# Patient Record
Sex: Female | Born: 1980 | Race: White | Hispanic: No | Smoking: Never smoker
Health system: Southern US, Community
[De-identification: ages and names within clinical notes are randomized; demographics above are authoritative.]

## PROBLEM LIST (undated history)

## (undated) DIAGNOSIS — C539 Malignant neoplasm of cervix uteri, unspecified: Secondary | ICD-10-CM

## (undated) DIAGNOSIS — N200 Calculus of kidney: Secondary | ICD-10-CM

## (undated) DIAGNOSIS — E66813 Obesity, class 3: Secondary | ICD-10-CM

## (undated) DIAGNOSIS — D279 Benign neoplasm of unspecified ovary: Secondary | ICD-10-CM

## (undated) DIAGNOSIS — N390 Urinary tract infection, site not specified: Secondary | ICD-10-CM

## (undated) DIAGNOSIS — N159 Renal tubulo-interstitial disease, unspecified: Secondary | ICD-10-CM

## (undated) DIAGNOSIS — Z923 Personal history of irradiation: Secondary | ICD-10-CM

## (undated) HISTORY — DX: Obesity, class 3: E66.813

## (undated) HISTORY — DX: Personal history of irradiation: Z92.3

## (undated) HISTORY — PX: OTHER SURGICAL HISTORY: SHX169

## (undated) HISTORY — PX: LAPAROSCOPIC UNILATERAL SALPINGO OOPHERECTOMY: SHX5935

## (undated) HISTORY — DX: Morbid (severe) obesity due to excess calories: E66.01

---
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
## 1998-01-22 ENCOUNTER — Inpatient Hospital Stay (HOSPITAL_COMMUNITY): Admission: AD | Admit: 1998-01-22 | Discharge: 1998-01-22 | Payer: Self-pay | Admitting: Obstetrics and Gynecology

## 1998-02-23 ENCOUNTER — Inpatient Hospital Stay (HOSPITAL_COMMUNITY): Admission: AD | Admit: 1998-02-23 | Discharge: 1998-02-23 | Payer: Self-pay | Admitting: Obstetrics and Gynecology

## 1998-04-01 ENCOUNTER — Other Ambulatory Visit: Admission: RE | Admit: 1998-04-01 | Discharge: 1998-04-01 | Payer: Self-pay | Admitting: *Deleted

## 1998-04-29 ENCOUNTER — Inpatient Hospital Stay (HOSPITAL_COMMUNITY): Admission: AD | Admit: 1998-04-29 | Discharge: 1998-05-01 | Payer: Self-pay | Admitting: Obstetrics and Gynecology

## 1999-02-17 ENCOUNTER — Other Ambulatory Visit: Admission: RE | Admit: 1999-02-17 | Discharge: 1999-02-17 | Payer: Self-pay | Admitting: *Deleted

## 1999-04-11 ENCOUNTER — Inpatient Hospital Stay (HOSPITAL_COMMUNITY): Admission: AD | Admit: 1999-04-11 | Discharge: 1999-04-11 | Payer: Self-pay | Admitting: *Deleted

## 1999-07-24 ENCOUNTER — Inpatient Hospital Stay (HOSPITAL_COMMUNITY): Admission: AD | Admit: 1999-07-24 | Discharge: 1999-07-24 | Payer: Self-pay | Admitting: *Deleted

## 1999-07-25 ENCOUNTER — Inpatient Hospital Stay (HOSPITAL_COMMUNITY): Admission: AD | Admit: 1999-07-25 | Discharge: 1999-07-25 | Payer: Self-pay | Admitting: *Deleted

## 1999-07-26 ENCOUNTER — Inpatient Hospital Stay (HOSPITAL_COMMUNITY): Admission: AD | Admit: 1999-07-26 | Discharge: 1999-07-26 | Payer: Self-pay | Admitting: Obstetrics & Gynecology

## 1999-08-04 ENCOUNTER — Inpatient Hospital Stay (HOSPITAL_COMMUNITY): Admission: AD | Admit: 1999-08-04 | Discharge: 1999-08-07 | Payer: Self-pay | Admitting: Obstetrics & Gynecology

## 1999-08-04 ENCOUNTER — Inpatient Hospital Stay (HOSPITAL_COMMUNITY): Admission: AD | Admit: 1999-08-04 | Discharge: 1999-08-04 | Payer: Self-pay | Admitting: Obstetrics and Gynecology

## 1999-11-13 ENCOUNTER — Encounter: Payer: Self-pay | Admitting: Emergency Medicine

## 1999-11-13 ENCOUNTER — Emergency Department (HOSPITAL_COMMUNITY): Admission: EM | Admit: 1999-11-13 | Discharge: 1999-11-14 | Payer: Self-pay | Admitting: Emergency Medicine

## 1999-11-30 ENCOUNTER — Inpatient Hospital Stay (HOSPITAL_COMMUNITY): Admission: RE | Admit: 1999-11-30 | Discharge: 1999-12-03 | Payer: Self-pay | Admitting: Obstetrics & Gynecology

## 1999-11-30 ENCOUNTER — Encounter (INDEPENDENT_AMBULATORY_CARE_PROVIDER_SITE_OTHER): Payer: Self-pay | Admitting: Specialist

## 1999-12-04 ENCOUNTER — Encounter: Payer: Self-pay | Admitting: Emergency Medicine

## 1999-12-04 ENCOUNTER — Emergency Department (HOSPITAL_COMMUNITY): Admission: EM | Admit: 1999-12-04 | Discharge: 1999-12-04 | Payer: Self-pay | Admitting: Emergency Medicine

## 1999-12-06 DIAGNOSIS — D279 Benign neoplasm of unspecified ovary: Secondary | ICD-10-CM

## 1999-12-06 HISTORY — DX: Benign neoplasm of unspecified ovary: D27.9

## 2000-03-24 ENCOUNTER — Emergency Department (HOSPITAL_COMMUNITY): Admission: EM | Admit: 2000-03-24 | Discharge: 2000-03-24 | Payer: Self-pay | Admitting: Emergency Medicine

## 2000-07-29 ENCOUNTER — Emergency Department (HOSPITAL_COMMUNITY): Admission: EM | Admit: 2000-07-29 | Discharge: 2000-07-29 | Payer: Self-pay | Admitting: Emergency Medicine

## 2001-02-22 ENCOUNTER — Emergency Department (HOSPITAL_COMMUNITY): Admission: EM | Admit: 2001-02-22 | Discharge: 2001-02-22 | Payer: Self-pay | Admitting: Specialist

## 2001-04-28 ENCOUNTER — Inpatient Hospital Stay (HOSPITAL_COMMUNITY): Admission: AD | Admit: 2001-04-28 | Discharge: 2001-04-28 | Payer: Self-pay | Admitting: Obstetrics and Gynecology

## 2004-03-17 ENCOUNTER — Emergency Department (HOSPITAL_COMMUNITY): Admission: EM | Admit: 2004-03-17 | Discharge: 2004-03-17 | Payer: Self-pay | Admitting: Emergency Medicine

## 2004-03-17 ENCOUNTER — Emergency Department (HOSPITAL_COMMUNITY): Admission: EM | Admit: 2004-03-17 | Discharge: 2004-03-17 | Payer: Self-pay | Admitting: Family Medicine

## 2004-06-01 ENCOUNTER — Inpatient Hospital Stay (HOSPITAL_COMMUNITY): Admission: AD | Admit: 2004-06-01 | Discharge: 2004-06-01 | Payer: Self-pay | Admitting: Obstetrics and Gynecology

## 2004-06-02 ENCOUNTER — Emergency Department (HOSPITAL_COMMUNITY): Admission: EM | Admit: 2004-06-02 | Discharge: 2004-06-02 | Payer: Self-pay | Admitting: Emergency Medicine

## 2004-09-22 ENCOUNTER — Inpatient Hospital Stay (HOSPITAL_COMMUNITY): Admission: AD | Admit: 2004-09-22 | Discharge: 2004-09-22 | Payer: Self-pay | Admitting: *Deleted

## 2007-03-10 ENCOUNTER — Emergency Department (HOSPITAL_COMMUNITY): Admission: EM | Admit: 2007-03-10 | Discharge: 2007-03-11 | Payer: Self-pay | Admitting: *Deleted

## 2008-06-21 ENCOUNTER — Emergency Department (HOSPITAL_COMMUNITY): Admission: EM | Admit: 2008-06-21 | Discharge: 2008-06-22 | Payer: Self-pay | Admitting: Emergency Medicine

## 2008-06-29 ENCOUNTER — Emergency Department (HOSPITAL_COMMUNITY): Admission: EM | Admit: 2008-06-29 | Discharge: 2008-06-29 | Payer: Self-pay | Admitting: Emergency Medicine

## 2009-07-26 ENCOUNTER — Emergency Department (HOSPITAL_COMMUNITY): Admission: EM | Admit: 2009-07-26 | Discharge: 2009-07-26 | Payer: Self-pay | Admitting: Family Medicine

## 2009-07-26 ENCOUNTER — Emergency Department (HOSPITAL_COMMUNITY): Admission: EM | Admit: 2009-07-26 | Discharge: 2009-07-27 | Payer: Self-pay | Admitting: Emergency Medicine

## 2010-11-07 LAB — URINALYSIS, ROUTINE W REFLEX MICROSCOPIC
Glucose, UA: NEGATIVE mg/dL
Nitrite: NEGATIVE
Protein, ur: NEGATIVE mg/dL
Urobilinogen, UA: 1 mg/dL (ref 0.0–1.0)

## 2010-11-07 LAB — POCT I-STAT, CHEM 8
BUN: 7 mg/dL (ref 6–23)
HCT: 39 % (ref 36.0–46.0)
Hemoglobin: 13.3 g/dL (ref 12.0–15.0)
Sodium: 141 mEq/L (ref 135–145)
TCO2: 24 mmol/L (ref 0–100)

## 2010-11-07 LAB — POCT PREGNANCY, URINE: Preg Test, Ur: NEGATIVE

## 2010-12-23 NOTE — Discharge Summary (Signed)
[facility]  Patient:    Maria Davidson, Maria Davidson [facility]                    MRN: [id number] Adm. Date:  [id number] Disc. Date: [id number] Attending:  Donnetta Hutching                           Discharge Summary  NOTE:  The patient was originally discharged from [facility].  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  OPERATIONS AND PROCEDURES:  Exploratory laparotomy, right salpingo-oophorectomy, right ovarian cystectomy.  BRIEF HISTORY:  The patient is a 30 year old gravida 2, para 2, who presented with increasing abdominal girth and pelvic pain.  She was noted by ultrasound to have an approximately 30 cm pelvic mass.  This was removed without difficulty on November 10, 1999.  HOSPITAL COURSE:  Essentially unremarkable.  She was voiding well, ambulating well, tolerated p.o. well, at time of her discharge.  On postoperative day #3 her incision was clean and dry and intact.  She was afebrile and at this point there are no contraindications against her being discharged.  PHYSICAL EXAMINATION:  VITAL SIGNS:  Stable.  Patient is afebrile.  LUNGS:  Clear to auscultation bilaterally.  HEART:  Regular rate and rhythm.  ABDOMEN:  Soft, nontender, nondistended.  The incision was clean, dry and intact.  EXTREMITIES:  Revealed no clubbing, cyanosis, or edema.  DISCHARGE LABORATORY DATA:  White count 5, hemoglobin 10.1, hematocrit 29.8, platelets 187, pathology thus far shows no malignant cells on peritoneal washings.  DISCHARGE MEDICATION INSTRUCTIONS:  DIET:  As tolerated.  ACTIVITY:  Per [facility] OB/GYN instruction sheet.  MEDICATIONS:  Roxicet liquid for pain control.  FOLLOWUP:  The patient will return to the office on May, 2, 2001, for staple removal and then again in 6 weeks. DD:  12/03/99 TD:  12/07/99 Job: [id number]

## 2010-12-23 NOTE — Discharge Summary (Signed)
[facility]  Patient:    Maria Davidson, CRUTCHER [facility]                    MRN: [id number] Adm. Date:  [id number] Disc. Date: [id number] Attending:  Donnetta Hutching                           Discharge Summary  DISCHARGE DIAGNOSES: 1. Pelvic pain. 2. 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  OPERATIONS AND PROCEDURES: 1. Exploratory laparotomy. 2. Right salpingo-oophorectomy. 3. Right ovarian cystectomy.  HISTORY OF PRESENT ILLNESS:  The patient is a 30 year old who was found to have an approximately 30 cm pelvic mass.  The patient was also complaining of pelvic pain and increasing abdominal girth.  HOSPITAL COURSE:  She underwent an uneventful procedure on November 30, 1999. The hospital course was essentially unremarkable.  She was voiding well, ambulating well, and tolerating p.o. well at the time of her discharge on postoperative day #3.  The incision was clean, dry, and intact.  At this point there were no contraindication to her being discharged.  DISCHARGE PHYSICAL EXAMINATION:  Vital signs stable.  The patient was afebrile.  LUNGS:  Clear to auscultation bilaterally.  HEART:  Regular rate and rhythm.  ABDOMEN:  Soft and nondistended.  The incision was clean, dry, and intact.  EXTREMITIES:  No clubbing, cyanosis, or edema.  DISCHARGE LABORATORY DATA:  The white count was 5.0, hemoglobin 10.1, hematocrit 29.8, and platelets 187.  Pathology showed benign mucinous cystadenoma, unremarkable fallopian tube, and no evidence of borderline change or malignancy identified.  Peritoneal washings were negative.  DIET:  As tolerated.  ACTIVITY:  As per the [facility] OB/GYN instruction sheet.  DISCHARGE MEDICATIONS:  Liquid Roxicet as needed for pain.  FOLLOW-UP:  Will be in one week for staple removal and then in six weeks for postoperative check. DD:  12/19/99 TD:  12/20/99 Job: [id number]

## 2010-12-23 NOTE — H&P (Signed)
[facility]  Patient:    Maria Davidson, Maria Davidson [facility]                    MRN: [id number] Adm. Date:  [id number] Disc. Date: [id number] Attending:  Donnetta Hutching                         History and Physical  DATE OF BIRTH:  [birth year].  HISTORY OF PRESENT ILLNESS:  The patient is a 30 year old gravida 2, para 2, who is status post vaginal delivery in December.  The patient presents complaining of increasing abdominal girth and sudden onset of pelvic pain. The patient had previously been complaining of vaginal bleeding since the time of her delivery.  She was treated for this bleeding with Depo-Provera and subsequent to that was given estrogen tablets.  The patient states her lower abdominal pain began approximately the beginning of April and began worsening approximately April 6.  She has tried Motrin for the pain, that has worked with good success.  She denies nausea, vomiting, or diarrhea, but has had some dizziness over the initial weeks and has also had no strength.  Her increasing abdominal girth has been occurring for the past month and a half.  Her abdomen appears to be tender and swollen, "I still look pregnant."  The patient was evaluated at [facility] ER and found to have a large right adnexal mass.  The patient is therefore being consented for exploratory laparotomy and removal of this pelvic mass.  ALLERGIES:  The patient has no known drug allergies.  MEDICATIONS:  She is taking no medications on a regular basis.  She is status post injection of Depo-Provera on March 8.  PAST MEDICAL HISTORY:  She denies medical illnesses.  PAST SURGICAL HISTORY:  Noncontributory.  FAMILY HISTORY:  Uncle with diabetes.  Both grandfathers had lung cancer. Maternal grandmother also had lung cancer.  Sister has had cervical cancer.  SOCIAL HISTORY:  The patient is engaged.  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  She denies alcohol, drug, or  tobacco use.  REVIEW OF SYSTEMS:  Noncontributory.  PHYSICAL EXAMINATION:  VITAL SIGNS:  Stable, the patient is afebrile.  LUNGS:  Clear to auscultation bilaterally.  HEART:  Regular rate and rhythm.  ABDOMEN:  Soft, diffusely tender.  The margins of the mass appear to go from the suprapubic area to well above the umbilicus.  It is mobile.  There are no fluid waves.  EXTREMITIES:  No clubbing, cyanosis, or edema.  PELVIC:  External genitalia is within normal limits.  Vagina is without discharge.  Cervix is without lesion.  The uterus is difficult to palpate secondary to the mass.  There appears to be fullness in the right adnexa that goes up to the umbilicus, and rectovaginal confirms.  LABORATORY DATA:  GYN ultrasound shows a complex cystic mass within the pelvis that extends into the abdomen, appearing to arise from the right adnexa, measures 26.9 cm superiorly to inferiorly, 22.5 cm right to left, with scattered septations.  GC and Chlamydia cultures from March 2000 are negative. Tumor markers (LDH, CA-125, CEA, AFP tumor marker, and beta hCG) are all negative.  Hemoglobin 12.7, hematocrit 37.6.  ASSESSMENT:  Large pelvic mass arising into the abdomen, pelvic pain, history of metrorrhagia.  PLAN:  Patient has been consented for exploratory laparotomy, right ovarian cystectomy, and right salpingo-oophorectomy.  Risks, benefits, and alternatives to the procedure were  reviewed with the patient and her family. Risks included a risk of bleeding, risk of infection, risk of damage to internal organs such as bowel, bladder, or ureter.  It appears that this cyst is of a benign course but reviewed with the patient that should malignant cells be noted, then the GYN oncologist will be asked to perform lymph node dissection and omentectomy.  The patient and her family are willing to accept these risks and will return to the office for her postoperative visit. DD:  12/19/99 TD:   12/19/99 Job: [id number]

## 2010-12-23 NOTE — H&P (Signed)
[facility]  Patient:    LILOU, KNEIP [facility]                    MRN: [id number] Adm. Date:  [id number] Disc. Date: [id number] Attending:  Donnetta Hutching                         History and Physical  HISTORY OF PRESENT ILLNESS:  The patient is a 30 year old gravida 2, para 2, who presented with increasing abdominal girth and pelvic pain.  The patient was noted to have an approximately 30 cm pelvic mass.  Her tumor markers are normal.  She is consented for exploratory laparotomy and removal of said pelvic mass.  PAST MEDICAL HISTORY: DD:  03/12/00 TD:  03/13/00 Job: [id number]

## 2011-05-09 LAB — DIFFERENTIAL
Basophils Absolute: 0
Eosinophils Absolute: 0
Eosinophils Relative: 0
Lymphs Abs: 2.1
Neutrophils Relative %: 72

## 2011-05-09 LAB — CBC
Hemoglobin: 12
MCHC: 33.3
MCV: 87.3
RBC: 4.12
WBC: 8.8

## 2011-05-09 LAB — URINE CULTURE

## 2011-05-09 LAB — D-DIMER, QUANTITATIVE: D-Dimer, Quant: 0.22

## 2011-05-09 LAB — COMPREHENSIVE METABOLIC PANEL
ALT: 12
AST: 14
CO2: 22
Calcium: 8.9
Chloride: 107
Creatinine, Ser: 0.62
GFR calc non Af Amer: >60
Glucose, Bld: 102 — ABNORMAL HIGH
Total Bilirubin: 0.8

## 2011-05-09 LAB — POCT PREGNANCY, URINE: Preg Test, Ur: NEGATIVE

## 2011-05-09 LAB — URINALYSIS, ROUTINE W REFLEX MICROSCOPIC
Bilirubin Urine: NEGATIVE
Ketones, ur: 15 — AB
Nitrite: POSITIVE — AB
Protein, ur: NEGATIVE
Urobilinogen, UA: 0.2

## 2011-05-22 LAB — URINE CULTURE

## 2011-05-22 LAB — URINALYSIS, ROUTINE W REFLEX MICROSCOPIC
Glucose, UA: NEGATIVE
Nitrite: NEGATIVE
Specific Gravity, Urine: 1.028
pH: 6

## 2011-05-22 LAB — DIFFERENTIAL
Eosinophils Absolute: 0
Lymphs Abs: 2.3
Monocytes Absolute: 0.5
Monocytes Relative: 9
Neutrophils Relative %: 55

## 2011-05-22 LAB — BASIC METABOLIC PANEL
CO2: 27
Chloride: 101
Creatinine, Ser: 0.66
GFR calc Af Amer: >60
Potassium: 3.3 — ABNORMAL LOW
Sodium: 138

## 2011-05-22 LAB — URINE MICROSCOPIC-ADD ON

## 2011-05-22 LAB — CBC
Hemoglobin: 13.3
MCHC: 34.6
MCV: 83.2
RBC: 4.62
WBC: 6.2

## 2011-05-22 LAB — PREGNANCY, URINE: Preg Test, Ur: NEGATIVE

## 2016-01-23 ENCOUNTER — Encounter (HOSPITAL_COMMUNITY): Payer: Self-pay

## 2016-01-23 ENCOUNTER — Inpatient Hospital Stay (HOSPITAL_COMMUNITY)
Admission: AD | Admit: 2016-01-23 | Discharge: 2016-01-23 | Disposition: A | Discharge status: Home / Self Care | Payer: Self-pay | Source: Ambulatory Visit | Attending: Obstetrics and Gynecology | Admitting: Obstetrics and Gynecology

## 2016-01-23 DIAGNOSIS — M549 Dorsalgia, unspecified: Secondary | ICD-10-CM

## 2016-01-23 DIAGNOSIS — M545 Low back pain: Secondary | ICD-10-CM | POA: Insufficient documentation

## 2016-01-23 DIAGNOSIS — R1900 Intra-abdominal and pelvic swelling, mass and lump, unspecified site: Secondary | ICD-10-CM | POA: Insufficient documentation

## 2016-01-23 DIAGNOSIS — Z87891 Personal history of nicotine dependence: Secondary | ICD-10-CM | POA: Insufficient documentation

## 2016-01-23 HISTORY — DX: Benign neoplasm of unspecified ovary: D27.9

## 2016-01-23 HISTORY — DX: Renal tubulo-interstitial disease, unspecified: N15.9

## 2016-01-23 HISTORY — DX: Urinary tract infection, site not specified: N39.0

## 2016-01-23 HISTORY — DX: Calculus of kidney: N20.0

## 2016-01-23 LAB — POCT PREGNANCY, URINE: PREG TEST UR: NEGATIVE

## 2016-01-23 LAB — URINALYSIS, ROUTINE W REFLEX MICROSCOPIC
Bilirubin Urine: NEGATIVE
Glucose, UA: NEGATIVE mg/dL
HGB URINE DIPSTICK: NEGATIVE
KETONES UR: NEGATIVE mg/dL
LEUKOCYTES UA: NEGATIVE
Nitrite: NEGATIVE
PROTEIN: NEGATIVE mg/dL
Specific Gravity, Urine: 1.025 (ref 1.005–1.030)
pH: 5.5 (ref 5.0–8.0)

## 2016-01-23 MED ORDER — CYCLOBENZAPRINE HCL 10 MG PO TABS: 10.0000 mg | ORAL_TABLET | Freq: Two times a day (BID) | ORAL | Status: DC | PRN

## 2016-01-23 NOTE — Discharge Instructions (Signed)

## 2016-01-23 NOTE — MAU Provider Note (Signed)
History   [id number]   Chief Complaint  Patient presents with  . Back Pain    HPI Maria Davidson is a 35 y.o. female  G2P2002 here with report of lower back pain that has become progressively worse in past 72 hours.  Pain shoots on lower back when rising from a bend.  Denies radiation down either leg.  Pain also increases after intercourse.  No report of UTI symptoms.  No dysfunctional uterine bleeding.  No change in bowel or bladder.   Patient's last menstrual period was 01/04/2016 (approximate).  OB History  Gravida Para Term Preterm AB SAB TAB Ectopic Multiple Living  2 2 2       2     # Outcome Date GA Lbr Len/2nd Weight Sex Delivery Anes PTL Lv  2 Term      Vag-Spont   Y  1 Term      Vag-Spont   Y      Past Medical History  Diagnosis Date  . Kidney stones   . Kidney infection   . 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 UTI (urinary tract infection)     History reviewed. No pertinent family history.  Social History   Social History  . Marital Status: Divorced    Spouse Name: N/A  . Number of Children: N/A  . Years of Education: N/A   Social History Main Topics  . Smoking status: Former Research scientist (life sciences)  . Smokeless tobacco: None  . Alcohol Use: Yes     Comment: occ  . Drug Use: No  . Sexual Activity: Yes    Birth Control/ Protection: None   Other Topics Concern  . None   Social History Narrative  . None    Allergies  Allergen Reactions  . Sulfa Antibiotics Swelling and Other (See Comments)    Reaction:  Tongue swelling     No current facility-administered medications on file prior to encounter.   No current outpatient prescriptions on file prior to encounter.     Review of Systems  Constitutional: Negative for fever, chills and appetite change.  Gastrointestinal: Negative for nausea, vomiting, abdominal pain, diarrhea, constipation and abdominal distention.  Genitourinary: Negative for dysuria, frequency, vaginal bleeding, vaginal discharge, vaginal pain and  pelvic pain.  Musculoskeletal: Positive for back pain.  Neurological: Negative for numbness and headaches.  All other systems reviewed and are negative.    Physical Exam   Filed Vitals:   01/23/16 1704 01/23/16 1709  BP: 132/45   Pulse: 97   Temp: 98.2 F (36.8 C)   TempSrc: Oral   Resp: 18   Height: 5\' 7"  (1.702 m)   Weight:  136.079 kg (300 lb)  SpO2: 99%     Physical Exam  Constitutional: She is oriented to person, place, and time. She appears well-developed and well-nourished. No distress.  HENT:  Head: Normocephalic.  Neck: Normal range of motion. Neck supple.  Cardiovascular: Normal rate, regular rhythm and normal heart sounds.   Respiratory: Effort normal and breath sounds normal. No respiratory distress.  GI: Soft. There is tenderness (with palpation of mid left quadrant).  Genitourinary: Left adnexum displays mass, tenderness and fullness.  Musculoskeletal: Normal range of motion. She exhibits no edema.       Lumbar back: She exhibits tenderness and pain. She exhibits normal range of motion and no spasm.  Pain with right lateral flexion of hip  Neurological: She is alert and oriented to person, place, and time. She has normal reflexes.  Skin: Skin  is warm and dry.    MAU Course  Procedures  MDM Results for orders placed or performed during the hospital encounter of 01/23/16 (from the past 24 hour(s))  Urinalysis, Routine w reflex microscopic (not at [facility])     Status: None   Collection Time: 01/23/16  5:12 PM  Result Value Ref Range   Color, Urine YELLOW YELLOW   APPearance CLEAR CLEAR   Specific Gravity, Urine 1.025 1.005 - 1.030   pH 5.5 5.0 - 8.0   Glucose, UA NEGATIVE NEGATIVE mg/dL   Hgb urine dipstick NEGATIVE NEGATIVE   Bilirubin Urine NEGATIVE NEGATIVE   Ketones, ur NEGATIVE NEGATIVE mg/dL   Protein, ur NEGATIVE NEGATIVE mg/dL   Nitrite NEGATIVE NEGATIVE   Leukocytes, UA NEGATIVE NEGATIVE  Pregnancy, urine POC     Status: None   Collection  Time: 01/23/16  5:23 PM  Result Value Ref Range   Preg Test, Ur NEGATIVE NEGATIVE     Assessment and Plan  Back Pain Pelvic Mass  Plan: Pelvic Ultrasound > outpatient RX Flexeril Reviewed warning signs May refer to ortho, pending pelvic ultrasound results  Gwen Pounds, CNM 01/23/2016 8:25 PM

## 2016-01-23 NOTE — MAU Note (Signed)
Pt C/O lower back pain "for a long time", became worse in last 72 hours.  Has occasional SOB @ times, especially when bending over.  Hx of UTI in past.  Denies fever or dysuria.

## 2016-01-26 ENCOUNTER — Ambulatory Visit (HOSPITAL_COMMUNITY)
Admission: RE | Admit: 2016-01-26 | Discharge: 2016-01-26 | Disposition: A | Discharge status: Home / Self Care | Payer: Self-pay | Source: Ambulatory Visit | Attending: Family | Admitting: Family

## 2016-01-26 DIAGNOSIS — M549 Dorsalgia, unspecified: Secondary | ICD-10-CM | POA: Insufficient documentation

## 2016-01-27 ENCOUNTER — Telehealth: Payer: Self-pay | Admitting: General Practice

## 2016-01-27 NOTE — Telephone Encounter (Signed)
Called patient regarding limited but normal ultrasound results. Patient verbalized understanding & asked about next steps for her back pain. Asked patient if she had insurance and she stated no. Told patient to contact [employer] to receive orange card application as well as starting primary care. Told patient they may can assist in helping with her back pain or once approved for the orange card they may be able to refer her to an orthopedist. Also recommended chiropractor. Patient verbalized understanding to all & had no questions

## 2016-01-31 ENCOUNTER — Ambulatory Visit (HOSPITAL_COMMUNITY): Payer: Self-pay

## 2021-01-15 ENCOUNTER — Inpatient Hospital Stay (HOSPITAL_COMMUNITY)
Admission: AD | Admit: 2021-01-15 | Discharge: 2021-01-15 | Disposition: A | Discharge status: Home / Self Care | Payer: Commercial Managed Care - PPO | Attending: Obstetrics and Gynecology | Admitting: Obstetrics and Gynecology

## 2021-01-15 ENCOUNTER — Other Ambulatory Visit: Payer: Self-pay

## 2021-01-15 DIAGNOSIS — N939 Abnormal uterine and vaginal bleeding, unspecified: Secondary | ICD-10-CM

## 2021-01-15 DIAGNOSIS — Z3202 Encounter for pregnancy test, result negative: Secondary | ICD-10-CM | POA: Diagnosis not present

## 2021-01-15 NOTE — MAU Provider Note (Signed)
Event Date/Time   First Provider Initiated Contact with Patient 01/15/21 0855      S Maria Davidson is a 40 y.o. [id number] patient who presents to MAU today with complaint of vaginal bleeding. Reports irregular bleeding for the last 6 months. Has an appointment with [facility] later this month to address this issue. Reports bleeding increased over the last few days. Takes 3 hours to saturate a pad. Denies abdominal pain. Increase in clear mucoid discharge as well. Bleeding decreased this morning. Has felt more tired recently. No dizziness or syncope.    O BP 124/77 (BP Location: Right Arm)   Pulse 95   Temp 97.6 F (36.4 C) (Oral)   Resp 16   SpO2 98% Comment: room air Physical Exam Vitals and nursing note reviewed.  Constitutional:      General: She is not in acute distress.    Appearance: Normal appearance. She is not ill-appearing.  HENT:     Head: Normocephalic and atraumatic.  Eyes:     General: No scleral icterus. Pulmonary:     Effort: Pulmonary effort is normal. No respiratory distress.  Neurological:     Mental Status: She is alert.  Psychiatric:        Mood and Affect: Mood normal.        Behavior: Behavior normal.    A Medical screening exam complete   P Discharge from MAU in stable condition Patient given the option of transfer to [facility] for further evaluation or seek care in outpatient facility of choice - patient prefers to wait for her [facility] appointment Warning signs for worsening condition that would warrant emergency follow-up discussed   Jorje Guild, NP 01/15/2021 8:55 AM

## 2021-01-15 NOTE — MAU Note (Signed)
Maria Davidson is a 40 y.o. here in MAU reporting: for the past 3-4 months has been having irregular periods that are sometimes heavier and is having a lot of cramping. Also having a vaginal discharge that has an odor for the past 3 weeks. 2 days ago she started having heavy bleeding but states no pain. Pt has an appointment at the end of the month at [facility].   LMP: 11/19/20  Onset of complaint: ongoing  Pain score: 0/10  Vitals:   01/15/21 0846  BP: 124/77  Pulse: 95  Resp: 16  Temp: 97.6 F (36.4 C)  SpO2: 98%     Lab orders placed from triage: UPT

## 2021-01-17 LAB — POCT PREGNANCY, URINE: Preg Test, Ur: NEGATIVE

## 2021-06-13 ENCOUNTER — Telehealth: Payer: Self-pay | Admitting: *Deleted

## 2021-06-13 ENCOUNTER — Encounter: Payer: Self-pay | Admitting: Gynecologic Oncology

## 2021-06-13 NOTE — Telephone Encounter (Signed)
Spoke with the patient and scheduled a new patient appt on 11/9 at 10:30 am; arrival time given of 10 am. Patient given the address and phone number of the clinic; along with the policy for mask and visitors.

## 2021-06-14 ENCOUNTER — Telehealth: Payer: Self-pay

## 2021-06-14 ENCOUNTER — Ambulatory Visit
Admission: RE | Admit: 2021-06-14 | Discharge: 2021-06-14 | Disposition: A | Discharge status: Home / Self Care | Payer: Self-pay | Source: Ambulatory Visit | Attending: Gynecologic Oncology | Admitting: Gynecologic Oncology

## 2021-06-14 ENCOUNTER — Other Ambulatory Visit: Payer: Self-pay

## 2021-06-14 DIAGNOSIS — C539 Malignant neoplasm of cervix uteri, unspecified: Secondary | ICD-10-CM

## 2021-06-14 NOTE — Telephone Encounter (Signed)
Received call from Maria Davidson. Patient states she called her OB office to follow up with them after her colposcopy last week. She is having vaginal bleeding, no pain and some cramping. Patient due to start her menstrual cycle. Per patient OB triage instructed her to notify them if she is changing her pad hourly. Patient reports changing pad every 2-3 hours. Patient wondering if she needs to do anything differently prior to her appointment tomorrow. Instructed patient that we will still be able to see her tomorrow with the bleeding. If she has questions or concerns related to her bleeding to contact her OB office. Patient verbalized understanding.

## 2021-06-15 ENCOUNTER — Telehealth: Payer: Self-pay | Admitting: Oncology

## 2021-06-15 ENCOUNTER — Inpatient Hospital Stay: Payer: Commercial Managed Care - PPO | Attending: Gynecologic Oncology | Admitting: Gynecologic Oncology

## 2021-06-15 ENCOUNTER — Encounter: Payer: Self-pay | Admitting: Hematology and Oncology

## 2021-06-15 ENCOUNTER — Encounter: Payer: Self-pay | Admitting: Gynecologic Oncology

## 2021-06-15 ENCOUNTER — Encounter: Payer: Self-pay | Admitting: Oncology

## 2021-06-15 ENCOUNTER — Other Ambulatory Visit: Payer: Self-pay

## 2021-06-15 VITALS — BP 149/63 | HR 66 | Temp 98.5°F | Resp 18 | Ht 67.0 in | Wt 298.0 lb

## 2021-06-15 DIAGNOSIS — Z6841 Body Mass Index (BMI) 40.0 and over, adult: Secondary | ICD-10-CM | POA: Diagnosis not present

## 2021-06-15 DIAGNOSIS — C539 Malignant neoplasm of cervix uteri, unspecified: Secondary | ICD-10-CM | POA: Diagnosis present

## 2021-06-15 NOTE — Progress Notes (Signed)
GYNECOLOGIC ONCOLOGY NEW PATIENT CONSULTATION   Patient Name: Maria Davidson  Patient Age: 40 y.o. Date of Service: 06/15/21 Referring Provider: Dr. 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  Discussed findings on my exam in detail as well as recent biopsies. She has a tumor replacing most of her cervix on exam. I do not think that additional biopsies are need to confirm that this is invasive adenocarcinoma. By my exam, she does not have obvious extra-cervical disease, but her exam is somewhat limited by body habitus.   The patient was counseled extensively on the treatment options for an early cervical cancer including radical hysterectomy, pelvic and para-aortic lymph node dissection +/- BSO versus chemo-radiation with daily external beam radiation followed by intracavitary brachytherapy. We discussed equal cure rates and survival with each modality. We discussed the differences in toxicity, including fistula, bowel obstruction, radiation proctitis/enteritis/cystitis with XRT and long term sequela versus the risks of radical hysterectomy (see below). I am concerned about the feasibility of a radical hysterectomy given her weight and that, given the size of her tumor, she is a high risk for needing adjuvant radiation.   Given her histology as well as size of the tumor, I think she may be a good candidate for a post-treatment extrafascial hysterectomy. I spoke with our radiation oncologist about planning for an exam after she finishes EBRT and before she would start IC brachytherapy.   In terms of next steps, I have recommended that we proceed with a PET scan to rule our metastatic disease. I have asked our nurse navigator to schedule appointments with radiation and medical oncology. We reviewed the need for sensitizing chemotherapy during external beam radiation  therapy.   A copy of this note was sent to the patient's referring provider.   85 minutes of total time was spent for this patient encounter, including preparation, face-to-face counseling with the patient and coordination of care, and documentation of the encounter.   Jeral Pinch, MD  Division of Gynecologic Oncology  Department of Obstetrics and Gynecology  [facility]  ___________________________________________  Chief Complaint: No chief complaint on file.   History of Present Illness:  Maria Davidson is a 40 y.o. y.o. female who is seen in consultation at the request of Dr. Alesia Richards for an evaluation of at least AIS of the cervix.   Patient reports having vaginal discharge starting in March of this year.  She describes it as clear and mucus and significant in quantity.  It was intermittent at first but progressed in terms of its frequency.  She notes having normal menses until about 7 months ago.  Over that time, she will have almost monthly menses although her bleeding may start a little earlier or later than it normally would.  She has had a couple of menstrual cycles that are shorter in length and normal.  In June, she had a period for 3-6 days with significant back pain and pelvic cramping.  This was more bleeding than she had normally had and went to [facility] but was not fully evaluated there as she was not pregnant.  On June 24, she had an appointment with her gynecologist.  She had a Pap smear performed at that time and was told that she had blood "sitting on the cervix".  At that appointment, pelvic ultrasound was recommended.  She had an ultrasound on 27 June  and then a visit with a provider for the results.  Given abnormal Pap smear findings (atypical glandular cells, a high risk HPV positive), colposcopy was recommended.  Patient ultimately decided to wait to see if her menstrual cycles got more regular over the next few months before undergoing  colposcopy.  She then presented to the emergency department at [facility] in [place] on 10/29 complaining of pelvic pain and lower back pain for a week.  She describes this today as having significant low back pain and stabbing lower abdominal pain.  She also noted cloudy urine with strong odor.  Imaging was obtained during that visit noted below, with no obvious source for abdominal pain.  Patient was ultimately treated for acute cystitis without hematuria.  Urine culture ultimately showed 70,000 colony-forming units of E. coli.  Patient underwent colposcopy on 11/3.  Findings at that time was a fleshy, friable mass occupying most of the cervix measuring 3 x 3 cm with polypoid protrusions.  Cervical os unable to be identified.  Biopsies were taken that revealed at least adenocarcinoma in situ.  Since her emergency department visit, she denies any vaginal discharge.  She had a little bit of bleeding after being seen in the emergency department and after her cervical biopsies.  This past Saturday, she had some brighter bleeding in the morning and some cramping on Sunday.  She denies any significant family history.  She thinks that her last Pap before Pap test in June was 5 or 6 years ago.  She denies any abnormal Pap smear history.  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  Patient lives in [place] with her fianc and son.  She works as a Architect at [employer].  She denies any tobacco or alcohol use.  PAST MEDICAL HISTORY:  Past Medical History:  Diagnosis Date   Obesity, Class III, BMI 40-49.9 (morbid obesity) ([place])    Ovarian tumor (benign) 12/06/1999     PAST SURGICAL HISTORY:  Past Surgical History:  Procedure Laterality Date   LAPAROSCOPIC UNILATERAL SALPINGO OOPHERECTOMY     right side - lap    OB/GYN HISTORY:  OB History  Gravida Para Term Preterm AB Living  2 2 2     2   SAB IAB Ectopic Multiple Live Births           2    # Outcome Date GA Lbr Len/2nd Weight Sex Delivery Anes PTL Lv  2 Term      Vag-Spont   LIV  1 Term      Vag-Spont   LIV    No LMP recorded.  Age at menarche: 6-15 Age at menopause: Not applicable Hx of HRT: Not applicable Hx of STDs: HPV Last pap: 01/28/2021: Atypical glandular cells favoring endocervical origin, HPV 18/45 positive History of abnormal pap smears: Only most recent per HPI  SCREENING STUDIES:  Last mammogram: Has never had  Last colonoscopy: Has never had  MEDICATIONS: No outpatient encounter medications on file as of 06/15/2021.   No facility-administered encounter medications on file as of 06/15/2021.    ALLERGIES:  Allergies  Allergen Reactions   Sulfa Antibiotics Swelling and Other (See Comments)    Reaction:  Tongue swelling      FAMILY HISTORY:  Family History  Problem Relation Age of Onset   Colon cancer Neg Hx    Breast cancer Neg Hx    Ovarian cancer Neg Hx    Endometrial cancer Neg Hx  Pancreatic cancer Neg Hx    Prostate cancer Neg Hx      SOCIAL HISTORY:  Social Connections: Not on file    REVIEW OF SYSTEMS:  Denies appetite changes, fevers, chills, fatigue, unexplained weight changes. Denies hearing loss, neck lumps or masses, mouth sores, ringing in ears or voice changes. Denies cough or wheezing.  Denies shortness of breath. Denies chest pain or palpitations. Denies leg swelling. Denies abdominal distention, pain, blood in stools, constipation, diarrhea, nausea, vomiting, or early satiety. Denies pain with intercourse, dysuria, frequency, hematuria or incontinence. Denies hot flashes, pelvic pain, vaginal bleeding or vaginal discharge.   Denies joint pain, back pain or muscle pain/cramps. Denies itching, rash, or wounds. Denies dizziness, headaches, numbness or seizures. Denies swollen lymph nodes or glands, denies easy bruising or bleeding. Denies anxiety, depression, confusion, or decreased concentration.  Physical  Exam:  Vital Signs for this encounter:  Blood pressure (!) 149/63, pulse 66, temperature 98.5 F (36.9 C), resp. rate 18, height 5\' 7"  (1.702 m), weight 298 lb (135.2 kg), SpO2 100 %. Body mass index is 46.67 kg/m. General: Alert, oriented, no acute distress.  HEENT: Normocephalic, atraumatic. Sclera anicteric.  Chest: Clear to auscultation bilaterally. No wheezes, rhonchi, or rales. Cardiovascular: Regular rate and rhythm, no murmurs, rubs, or gallops.  Abdomen: Obese. Normoactive bowel sounds. Soft, nondistended, nontender to palpation. No masses or hepatosplenomegaly appreciated. No palpable fluid wave.  Well-healed midline incision. Extremities: Grossly normal range of motion. Warm, well perfused. No edema bilaterally.  Skin: No rashes or lesions.  Lymphatics: No cervical, supraclavicular, or inguinal adenopathy.  GU:  Normal external female genitalia.   No lesions. No discharge or bleeding. On speculum exam, the vaginal mucosa is well rugated, no lesions or masses within the vagina.  The cervix itself is almost completely replaced by an approximately 5 x 3-1/2 cm frondular tumor.  There is mucus clear discharge near the apex of the vagina.  There is some very atypical appearing vessels within the tumor itself.  There is a small crescent of normal-appearing cervix on speculum exam, otherwise the cervix appears completely replaced.  On bimanual exam, tumor feels mostly exophytic.  There is normal cervix around the tumor and no palpable extension of the tumor to the vaginal sidewalls.  The cervix itself feels firm and barrel-shaped.  On rectovaginal exam, exam is somewhat limited by body habitus but no obvious evidence of parametrial extension or nodularity.  LABORATORY AND RADIOLOGIC DATA:  Outside medical records were reviewed to synthesize the above history, along with the history and physical obtained during the visit.   Lab Results  Component Value Date   WBC 8.8 06/21/2008   HGB 13.3  07/27/2009   HCT 39.0 07/27/2009   PLT 254 06/21/2008   GLUCOSE 109 (H) 07/27/2009   ALT 12 06/21/2008   AST 14 06/21/2008   NA 141 07/27/2009   K 3.6 07/27/2009   CL 107 07/27/2009   CREATININE 0.3 (L) 07/27/2009   BUN 7 07/27/2009   CO2 22 06/21/2008   Cervical biopsies on 06/07/2021: Biopsies from 10 and 12:00 show adenocarcinoma, at least in situ.  Limited stroma present therefore invasion cannot be ruled out.  CT abdomen and pelvis on 10/34: left lower quadrant abdominal pain: No acute process identified.  2.5 cm benign-appearing left ovarian cyst noted.  No evidence of appendicitis.  No adenopathy or osseous processes noted.  Pelvic ultrasound performed on 10/30: Uterus measures 10.5 x 5.5 x 6.4 cm with a possible 1.7 anterior  uterine wall fibroid.  Endometrium 6.1 mm and heterogenous.  No acute process identified.  Pelvic ultrasound performed on 6/27: Uterus measures 7.6 x 5.6 x 6.9 cm with an endometrial lining of 12.1 mm.  Left ovary measures 3.8 x 3.4 x 3 cm  Blood work from recent emergency department visit: WBC 10.7, hemoglobin 13.3, hematocrit 40.5, platelets 328 Creatinine 0.64, glucose 114 LFTs normal

## 2021-06-15 NOTE — Progress Notes (Signed)
Met with Maria Davidson in the [facility] clinic.  Introduced myself as the [employer] and went over plan for concurrent chemotherapy and radiation.  Reviewed upcoming appointments and provided her with [facility].  Encouraged her to call with any questions or needs.

## 2021-06-15 NOTE — Telephone Encounter (Signed)
Maria Davidson called and went over plan for chemotherapy and radiation followed by consideration for surgery. All questions answered and she knows to call if she needs anything.

## 2021-06-15 NOTE — Patient Instructions (Signed)
It was a pleasure meeting you today.  I will call you once I have your PET scan results.  In the meantime, I am having Maria Davidson such you up to see our radiation oncologist as well as her medical oncologist.  As we talked about today, we will proceed with plans for treatment with radiation and chemotherapy to help the radiation work better.  Based on what I am seeing in feeling today, I think that your cancer is confined to the cervix, meaning that it has not spread to other places.  The PET scan will help [place] look for evidence of cancer spread as well.

## 2021-06-17 ENCOUNTER — Telehealth: Payer: Self-pay | Admitting: Oncology

## 2021-06-17 ENCOUNTER — Telehealth: Payer: Self-pay | Admitting: Gynecologic Oncology

## 2021-06-17 ENCOUNTER — Ambulatory Visit: Payer: Commercial Managed Care - PPO | Admitting: Hematology and Oncology

## 2021-06-17 NOTE — Telephone Encounter (Signed)
Returned call to the patient. Patient stating she would like to have surgery as her treatment for cervical cancer. She is concerned about the side effects of chemo and radiation. She states she had a 9.5 lb tumor removed through her ovary and she does not know why we could not remove the cervix since it is smaller. She also states information she is reading in [place] states she has precancer and not cancer. Discussed the biopsy results along with the fact that two small biopsies are not 100% representative of the entire cervical mass.  Advised her on exam with Dr. Berline Lopes, her clinical exam findings are consistent with a cervical cancer.    Discussed information reviewed at the visit with Dr. Berline Lopes including reasons why a radical hysterectomy for upfront treatment is not indicated in her situation.  She states that she was told by her GYN that the tumor size was 3 cm.  Advised patient on examination with Dr. Berline Lopes, the cervix was measured at 5 cm and the entire cervix was replaced with tumor.  We discussed the goal for negative margins when proceeding with a radical hysterectomy and given the size of her tumor/BMI, a radical hysterectomy would not be indicated.  She is asking if there are guidelines that are followed or is it different from provider to provider.  She is thinking about the possibility of a second opinion.  Advised her that we support what ever decision she would like to do.  She is also concerned about the side effects of chemotherapy.  We discussed the use of cisplatin with the radiation.  She is also concerned about pain during Port-A-Cath insertion.  Reassured patient on this process.  We also discussed the role of the pet scan.  She would like to have this PET scan first before proceeding with appointments with radiation oncology and medical oncology. Attempted to answer all questions. Advised patient to please call the office for any further questions and her concerns would be forwarded to  Dr. Berline Lopes.

## 2021-06-17 NOTE — Telephone Encounter (Signed)
Mack called and asked for Dr. Berline Lopes to call her.  She wants to discuss risks of surgery vs chemoradiation. She also requested to reschedule her appointment with Dr. Alvy Bimler today to after her PET scan.  Rescheduled her appointment to 07/01/21 and advised that Dr. Berline Lopes is out of the office today but will be notified that she is requesting a call.

## 2021-06-22 NOTE — Progress Notes (Signed)
GYN Location of Tumor / Histology: Cervical  Maria Davidson presented with symptoms of: pelvic pain and bleeding  Biopsies revealed: abnormal Pap smear findings (atypical glandular cells, a high risk HPV positive),  Patient underwent colposcopy on 11/3.  Findings at that time was a fleshy, friable mass occupying most of the cervix measuring 3 x 3 cm with polypoid protrusions.  Cervical os unable to be identified.  Biopsies were taken that revealed at least adenocarcinoma in situ.  Past/Anticipated interventions by Gyn/Onc surgery, if any: Dr Berline Lopes - She has a tumor replacing most of her cervix on exam. I do not think that additional biopsies are need to confirm that this is invasive adenocarcinoma. Given her histology as well as size of the tumor, I think she may be a good candidate for a post-treatment extrafascial hysterectomy. I spoke with our radiation oncologist about planning for an exam after she finishes EBRT and before she would start IC brachytherapy.    In terms of next steps, I have recommended that we proceed with a PET scan to rule our metastatic disease. I have asked our nurse navigator to schedule appointments with radiation and medical oncology. We reviewed the need for sensitizing chemotherapy during external beam radiation therapy.    Past/Anticipated interventions by medical oncology, if any: pending appointment with Dr Alvy Bimler in medical oncology.  Weight changes, if any: no  Bowel/Bladder complaints, if any: No.  Nausea/Vomiting, if any: no  Pain issues, if any:  no  SAFETY ISSUES: Prior radiation? no Pacemaker/ICD? no Possible current pregnancy? no Is the patient on methotrexate? no  Current Complaints / other details:  none  Vitals:   06/27/21 1336  BP: (!) 158/90  Pulse: 86  Resp: 20  Temp: 97.9 F (36.6 C)  SpO2: 100%  Weight: 289 lb 9.6 oz (131.4 kg)  Height: 5' 7.5" (1.715 m)

## 2021-06-23 ENCOUNTER — Telehealth: Payer: Self-pay | Admitting: Oncology

## 2021-06-23 NOTE — Telephone Encounter (Signed)
Maria Davidson called and asked if she could move her radiation appointment to 07/01/21.  Advised her that Dr. [name] is not in the office that day.  She verbalized understanding and will keep her appointments on 06/27/21.

## 2021-06-24 ENCOUNTER — Telehealth: Payer: Self-pay | Admitting: Gynecologic Oncology

## 2021-06-24 ENCOUNTER — Telehealth: Payer: Self-pay

## 2021-06-24 ENCOUNTER — Other Ambulatory Visit: Payer: Self-pay

## 2021-06-24 ENCOUNTER — Ambulatory Visit (HOSPITAL_COMMUNITY)
Admission: RE | Admit: 2021-06-24 | Discharge: 2021-06-24 | Disposition: A | Discharge status: Home / Self Care | Payer: Commercial Managed Care - PPO | Source: Ambulatory Visit | Attending: Gynecologic Oncology | Admitting: Gynecologic Oncology

## 2021-06-24 DIAGNOSIS — C539 Malignant neoplasm of cervix uteri, unspecified: Secondary | ICD-10-CM | POA: Insufficient documentation

## 2021-06-24 LAB — GLUCOSE, CAPILLARY: Glucose-Capillary: 96 mg/dL (ref 70–99)

## 2021-06-24 IMAGING — PT NM PET TUM IMG INITIAL (PI) SKULL BASE T - THIGH
1 of 8 series · 1 of 25 positions shown · non-contrast
Comparison: CT abdomen and pelvis dated [DATE]

CLINICAL DATA: Initial treatment strategy for cervical cancer.

EXAM:
NUCLEAR MEDICINE PET SKULL BASE TO THIGH
TECHNIQUE: 15.2 mCi F-18 FDG was injected intravenously. Full-ring PET imaging
was performed from the skull base to thigh after the radiotracer. CT
data was obtained and used for attenuation correction and anatomic
localization.
Fasting blood glucose: 96 mg/dl

[Series 3: pet sk_thigh ac · axial · 5.0mm · 4.07mm/px · 1 of 242 slices shown]
[im 145/242]
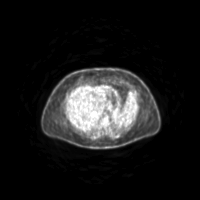

[1 of 25 positions shown; findings below may reference images not displayed]

FINDINGS: Mediastinal blood pool activity: SUV max

Liver activity: SUV max

NECK: No hypermetabolic lymph nodes in the neck.

Incidental CT findings: none

CHEST: No hypermetabolic mediastinal or hilar nodes. No suspicious
pulmonary nodules on the CT scan.

Incidental CT findings: none

ABDOMEN/PELVIS: Ill-defined soft tissue and hypermetabolic activity
at the expected area of the cervix with an SUV max 20.
Hypermetabolic activity of the left ovary, SUV max of 12.5. No
abnormal hypermetabolic activity within the liver, pancreas, adrenal
glands, or spleen. No hypermetabolic lymph nodes in the abdomen or
pelvis.

Incidental CT findings: none

SKELETON: No focal hypermetabolic activity to suggest skeletal
metastasis.

Incidental CT findings: none
IMPRESSION: 1. Ill-defined soft tissue and hypermetabolic activity at the
expected area of the cervix, compatible with primary malignancy.
2. No evidence of FDG avid metastatic disease in the chest, abdomen
or pelvis.
3. Hypermetabolic activity of the left ovary, likely physiologic.

## 2021-06-24 MED ORDER — FLUDEOXYGLUCOSE F - 18 (FDG) INJECTION
15.2000 | Freq: Once | INTRAVENOUS | Status: AC
  Administered 2021-06-24: 15.2 via INTRAVENOUS

## 2021-06-24 NOTE — Telephone Encounter (Signed)
Returning call to [facility]. Patient is scheduled for her PET scan today at 1pm and is requesting a call from Dr. Berline Lopes or Joylene John, NP as soon as results come available.  Notified patient that Dr. Berline Lopes is traveling and will do her best to call her when she sees the images. The scan is not always read over the weekend and the official report may not be back until Monday. Patient verbalized understanding.

## 2021-06-24 NOTE — Telephone Encounter (Signed)
Called patient once I'd reviewed PET scan images. As I got on the phone with her, official report also available. She is very happy with the news that PET scan does not show obvious evidence of cancer spread. I spoke with her for some time about PET scan, our prior long discussion at her visit (regarding my concerns with surgery given her weight, size of tumor, and likelihood that she would need adjuvant radiation), and upcoming visits within the [facility] system. I shared with her that I have spoken with Dr. Denman George about her case - she has a second opinion scheduled with Dr. Denman George which I encouraged her to keep. I also encouraged her to keep appts as scheduled with Dr. [name] and Maurilio Lovely. If she ultimately decides to get treatment at [facility], I asked her to let our team know as soon as possible to help facilitate getting treatment started for other patients.  Jeral Pinch MD Gynecologic Oncology

## 2021-06-26 NOTE — Progress Notes (Signed)
Radiation Oncology         [phone] ________________________________  Initial Outpatient Consultation  Name: Maria Davidson MRN: [id number]  Date: 06/27/2021  DOB: 03-08-1981  CC:Pcp, No  Lafonda Mosses, MD   REFERRING PHYSICIAN: Lafonda Mosses, MD  DIAGNOSIS: The encounter diagnosis was Malignant neoplasm of overlapping sites of cervix [facility]).   stage IB3 adenocarcinoma of the cervix.  HISTORY OF PRESENT ILLNESS::Maria Davidson is a 40 y.o. female who is accompanied by her fianc sounds good you have to give me an update on Thanksgiving. she is seen as a courtesy of Dr. Berline Lopes for an opinion concerning radiation therapy as part of management for her recently diagnosed cervical cancer. The patient presented to the [facility] on 06/04/21 with complaints of pelvic pain and lower back pain for one week. Upon evaluation in the ED, the patient reported significant low back pain, stabbing lower abdominal pain, and cloudy urine with a strong odor. Imaging performed during ED course showed no obvious source of abdominal pain. The patient was ultimately treated for acute cystitis without hematuria. Urine culture obtained showed E .coli colonies.  Subsequently, the patient underwent colposcopy on 06/09/21 which revealed a fleshy friable mass, occupying most of the cervix measuring 3 x 3 cm with polypoid protrusions. Biopsies were taken which revealed at least adenocarcinoma in situ.  The patient was then referred to Dr. Berline Lopes on 06/15/21 for further evaluation. During which time, the patient reported a history of significant clear vaginal discharge starting this past March. The patient stated that this was intermittent at first but progressed in frequency. Patient also reported significant back pain and pelvic pain during her menstrual cycle in June of this year, as well as more bleeding than usual.  Following this abnormal menstrual cycle, the patient presented to her gynecologist on  01/28/21. Pap smear performed on 01/28/21 revealed atypical glandular cells; high risk HPV positive. Per the patient, she stated that her gynecologist told her during this visit that she had blood "sitting on the cervix".   Speculum exam performed by Dr. Berline Lopes on 06/15/21 revealed the cervix as  almost completely replaced by an approximately 5 x 3-1/2 cm frondular tumor. Atypical vessels were also seen within the tumor itself. A small crescent of normal-appearing cervix was appreciated on speculum exam, otherwise, the cervix appeared completely replaced by tumor as well. On bimanual exam performed, the tumor was noted as mostly exophytic upon palpation. Normal cervix was otherwise seen around the tumor, with no palpable extension of the tumor to the vaginal sidewalls noted.  The patient was otherwise noted to deny any vaginal discharge other than a little bit of bleeding after being seen in the emergency department and after her cervical biopsies.   Dr. Berline Lopes accordingly ordered PET to rule out any metastatic disease.   PET on 06/24/21 revealed ill-defined soft tissue and hypermetabolic activity at the expected area of the cervix, compatible with primary malignancy. No evidence of FDG avid metastatic disease in the chest, abdomen, or pelvis was otherwise seen. Of note: hypermetabolic activity was seen of the left ovary, though noted as likely physiologic.   Other pertinent imaging includes:  --Pelvic US on 01/31/21 which revealed the uterus to measure 7.6 x 5.6 x 6.9 cm with an endometrial lining of 12.1 mm. Left ovaries were also seen to measure 3.8 x 3.4 x 3 cm.  --Pelvic US on 06/05/21 demonstrated the uterus to measure 10.5 x 5.5 x 6.4 cm with a possible 1.7 anterior  uterine wall fibroid.  Endometrium was seen to measure 6.1 mm and appear heterogenous.  No acute processes were identified otherwise. --CT of the chest abdomen and pelvis on 06/05/21 showed a 2.5 cm benign-appearing left ovarian cyst.  No adenopathy or osseous processes noted.  She reports having a large ovarian mass removed in the past which she reports weighing approximately 9 pounds.  She was told this was a benign tumor and no adjuvant treatment was recommended (2001)  Family history is significant for mother with history of uterine cancer operated on by Dr. Denman George.    PREVIOUS RADIATION THERAPY: No  PAST MEDICAL HISTORY:  Past Medical History:  Diagnosis Date   Obesity, Class III, BMI 40-49.9 (morbid obesity) ([place])    Ovarian tumor (benign) 12/06/1999    PAST SURGICAL HISTORY: Past Surgical History:  Procedure Laterality Date   LAPAROSCOPIC UNILATERAL SALPINGO OOPHERECTOMY     right side - lap    FAMILY HISTORY:  Family History  Problem Relation Age of Onset   Colon cancer Neg Hx    Breast cancer Neg Hx    Ovarian cancer Neg Hx    Endometrial cancer Neg Hx    Pancreatic cancer Neg Hx    Prostate cancer Neg Hx     SOCIAL HISTORY:  Social History   Tobacco Use   Smoking status: Former   Smokeless tobacco: Never  Substance Use Topics   Alcohol use: Yes    Comment: occ   Drug use: No    ALLERGIES:  Allergies  Allergen Reactions   Sulfa Antibiotics Swelling and Other (See Comments)    Reaction:  Tongue swelling     MEDICATIONS:  No current outpatient medications on file.   No current facility-administered medications for this encounter.    REVIEW OF SYSTEMS:  A 10+ POINT REVIEW OF SYSTEMS WAS OBTAINED including neurology, dermatology, psychiatry, cardiac, respiratory, lymph, extremities, GI, GU, musculoskeletal, constitutional, reproductive, HEENT.  She denies any vaginal bleeding at this time.  She continues to have a yellowish to clear vaginal discharge.  She denies any pelvic pain.  She denies any hematuria or rectal bleeding.   PHYSICAL EXAM:  height is 5' 7.5" (1.715 m) and weight is 289 lb 9.6 oz (131.4 kg). Her temperature is 97.9 F (36.6 C). Her blood pressure is 158/90  (abnormal) and her pulse is 86. Her respiration is 20 and oxygen saturation is 100%.   General: Alert and oriented, in no acute distress HEENT: Head is normocephalic. Extraocular movements are intact. Neck: Neck is supple, no palpable cervical or supraclavicular lymphadenopathy. Heart: Regular in rate and rhythm with no murmurs, rubs, or gallops. Chest: Clear to auscultation bilaterally, with no rhonchi, wheezes, or rales. Abdomen: Soft, nontender, nondistended, with no rigidity or guarding.  Faint scar noted in the inferior umbilicus area from her prior pelvic surgery. Extremities: No cyanosis or edema. Lymphatics: see Neck Exam Skin: No concerning lesions. Musculoskeletal: symmetric strength and muscle tone throughout. Neurologic: Cranial nerves II through XII are grossly intact. No obvious focalities. Speech is fluent. Coordination is intact. Psychiatric: Judgment and insight are intact. Affect is appropriate.   On pelvic examination the external genitalia were unremarkable. A speculum exam was performed. There are no mucosal lesions noted in the vaginal vault.  A clear vaginal discharge is noted without any blood.  The cervix is essentially replaced by an exophytic mass which protrudes into the proximal vagina.  No obvious vaginal extension.  Minimal bleeding with exam.  On bimanual and rectovaginal  examination no obvious parametrial extension although exam is limited given the patient's body habitus.  The cervical mass is at least 4 cm in greatest dimension.  ECOG = 1  0 - Asymptomatic (Fully active, able to carry on all predisease activities without restriction)  1 - Symptomatic but completely ambulatory (Restricted in physically strenuous activity but ambulatory and able to carry out work of a light or sedentary nature. For example, light housework, office work)  2 - Symptomatic, <50% in bed during the day (Ambulatory and capable of all self care but unable to carry out any work  activities. Up and about more than 50% of waking hours)  3 - Symptomatic, >50% in bed, but not bedbound (Capable of only limited self-care, confined to bed or chair 50% or more of waking hours)  4 - Bedbound (Completely disabled. Cannot carry on any self-care. Totally confined to bed or chair)  5 - Death   Eustace Pen MM, Creech RH, Tormey DC, et al. [phone]). "Toxicity and response criteria of the [facility]". [place] Oncol. 5 (6): 649-55  LABORATORY DATA:  Lab Results  Component Value Date   WBC 8.8 06/21/2008   HGB 13.3 07/27/2009   HCT 39.0 07/27/2009   MCV 87.3 06/21/2008   PLT 254 06/21/2008   NEUTROABS 6.3 06/21/2008   Lab Results  Component Value Date   NA 141 07/27/2009   K 3.6 07/27/2009   CL 107 07/27/2009   CO2 22 06/21/2008   GLUCOSE 109 (H) 07/27/2009   CREATININE 0.3 (L) 07/27/2009   CALCIUM 8.9 06/21/2008      RADIOGRAPHY: NM PET Image Initial (PI) Skull Base To Thigh (F-18 FDG)  Result Date: 06/24/2021 CLINICAL DATA:  Initial treatment strategy for cervical cancer. EXAM: NUCLEAR MEDICINE PET SKULL BASE TO THIGH TECHNIQUE: 15.2 mCi F-18 FDG was injected intravenously. Full-ring PET imaging was performed from the skull base to thigh after the radiotracer. CT data was obtained and used for attenuation correction and anatomic localization. Fasting blood glucose: 96 mg/dl COMPARISON:  CT abdomen and pelvis dated June 14, 2021 FINDINGS: Mediastinal blood pool activity: SUV max 2.9 Liver activity: SUV max 3.9 NECK: No hypermetabolic lymph nodes in the neck. Incidental CT findings: none CHEST: No hypermetabolic mediastinal or hilar nodes. No suspicious pulmonary nodules on the CT scan. Incidental CT findings: none ABDOMEN/PELVIS: Ill-defined soft tissue and hypermetabolic activity at the expected area of the cervix with an SUV max 20. Hypermetabolic activity of the left ovary, SUV max of 12.5. No abnormal hypermetabolic activity within the liver,  pancreas, adrenal glands, or spleen. No hypermetabolic lymph nodes in the abdomen or pelvis. Incidental CT findings: none SKELETON: No focal hypermetabolic activity to suggest skeletal metastasis. Incidental CT findings: none IMPRESSION: 1. Ill-defined soft tissue and hypermetabolic activity at the expected area of the cervix, compatible with primary malignancy. 2. No evidence of FDG avid metastatic disease in the chest, abdomen or pelvis. 3. Hypermetabolic activity of the left ovary, likely physiologic. Electronically Signed   By: Yetta Glassman M.D.   On: 06/24/2021 16:26      IMPRESSION: stage IB3 adenocarcinoma of the cervix.  The patient would prefer to have surgery and avoid the potential effects of radiation and chemotherapy.  Dr. Berline Lopes has felt that surgery may not be indicated given the tumor size as well as patient's BMI of 44.69.  In addition the patient has had prior pelvic surgery with her large benign ovarian tumor and it is possible she may  have some adhesions from this prior surgery.  if she were to proceed with surgery in all likelihood she would require postop radiation therapy given the tumor size and characteristics.  She will be seeing Dr. Denman George on November 28 for second opinion to see if she would be a candidate for upfront surgery.  If not then the patient will return to [facility] for her definitive treatment.  If she does have a good response to her initial course of external beam radiation therapy and radiosensitizing chemotherapy she may potentially be a candidate for extrafascial hysterectomy after 1 brachytherapy procedure.  The patient will be examined by Dr. Berline Lopes during the patient's first brachytherapy procedure to determine if this may be a potential option for the patient.  Today, I talked to the patient and fianc about the findings and work-up thus far.  We discussed the natural history of adenocarcinoma of the cervix and general treatment, highlighting the role of  radiotherapy in the management.  We discussed the available radiation techniques, and focused on the details of logistics and delivery.  We reviewed the anticipated acute and late sequelae associated with radiation in this setting.  The patient was encouraged to ask questions that I answered to the best of my ability.  A patient consent form was discussed and signed.  We retained a copy for our records.  The patient would like to proceed with radiation and will be scheduled for CT simulation.  PLAN: The patient will meet with Dr. Alvy Bimler on November 25.  Patient will meet with Dr. Denman George on November 28.  I have tentatively scheduled the patient for simulation on November 29 with treatments to begin December 5 or December 6.  Anticipate 5 weeks of external beam radiation therapy followed by intracavitary brachytherapy treatments.  Possible extrafascial hysterectomy after 1 brachytherapy procedure depending on the tumor response to radiation and chemotherapy.   65 minutes of total time was spent for this patient encounter, including preparation, face-to-face counseling with the patient and coordination of care, physical exam, and documentation of the encounter.   ------------------------------------------------  Blair Promise, PhD, MD  This document serves as a record of services personally performed by Gery Pray, MD. It was created on his behalf by Roney Mans, a trained medical scribe. The creation of this record is based on the scribe's personal observations and the provider's statements to them. This document has been checked and approved by the attending provider.

## 2021-06-27 ENCOUNTER — Other Ambulatory Visit: Payer: Self-pay

## 2021-06-27 ENCOUNTER — Encounter: Payer: Self-pay | Admitting: Radiation Oncology

## 2021-06-27 ENCOUNTER — Ambulatory Visit
Admission: RE | Admit: 2021-06-27 | Discharge: 2021-06-27 | Disposition: A | Discharge status: Home / Self Care | Payer: Commercial Managed Care - PPO | Source: Ambulatory Visit | Attending: Radiation Oncology | Admitting: Radiation Oncology

## 2021-06-27 ENCOUNTER — Other Ambulatory Visit: Payer: Self-pay | Admitting: Oncology

## 2021-06-27 VITALS — BP 158/90 | HR 86 | Temp 97.9°F | Resp 20 | Ht 67.5 in | Wt 289.6 lb

## 2021-06-27 DIAGNOSIS — C538 Malignant neoplasm of overlapping sites of cervix uteri: Secondary | ICD-10-CM

## 2021-06-27 DIAGNOSIS — R87810 Cervical high risk human papillomavirus (HPV) DNA test positive: Secondary | ICD-10-CM | POA: Diagnosis not present

## 2021-06-27 DIAGNOSIS — Z87891 Personal history of nicotine dependence: Secondary | ICD-10-CM | POA: Diagnosis not present

## 2021-06-27 DIAGNOSIS — N83202 Unspecified ovarian cyst, left side: Secondary | ICD-10-CM | POA: Diagnosis not present

## 2021-06-27 NOTE — Progress Notes (Signed)
See MD note for nursing evaluation. °

## 2021-06-27 NOTE — Progress Notes (Signed)
Gynecologic Oncology Multi-Disciplinary Disposition Conference Note  Date of the Conference: 06/27/2021  Patient Name: Maria Davidson  Referring Provider: Dr. Alesia Richards Primary GYN Oncologist: Dr. Berline Lopes  Stage/Disposition:  At least stage IB3 adenocarcinoma of the cervix. Disposition is to primary radiation with chemotherapy followed by consideration for extrafascial hysterectomy.   This Multidisciplinary conference took place involving physicians from [place], [place], Radiation Oncology, Pathology, Radiology along with the Gynecologic Oncology Nurse Practitioner and RN.  Comprehensive assessment of the patient's malignancy, staging, need for surgery, chemotherapy, radiation therapy, and need for further testing were reviewed. Supportive measures, both inpatient and following discharge were also discussed. The recommended plan of care is documented. Greater than 35 minutes were spent correlating and coordinating this patient's care.

## 2021-07-01 ENCOUNTER — Other Ambulatory Visit: Payer: Self-pay

## 2021-07-01 ENCOUNTER — Inpatient Hospital Stay: Payer: Commercial Managed Care - PPO | Admitting: Hematology and Oncology

## 2021-07-01 ENCOUNTER — Encounter: Payer: Self-pay | Admitting: Hematology and Oncology

## 2021-07-01 DIAGNOSIS — C539 Malignant neoplasm of cervix uteri, unspecified: Secondary | ICD-10-CM

## 2021-07-01 DIAGNOSIS — C538 Malignant neoplasm of overlapping sites of cervix uteri: Secondary | ICD-10-CM

## 2021-07-01 NOTE — Assessment & Plan Note (Signed)
We have extensive discussions about the approach and management of cervical cancer She has an appointment on Monday to see Dr. Denman George for second opinion for evaluation whether she would be a candidate for upfront surgery We expressed concern about her body habitus and prior surgery that might make upfront surgery prohibitive We discussed the role of concurrent chemoradiation therapy with weekly cisplatin We discussed the role of chemotherapy. The intent is of curative intent.  We discussed some of the risks, benefits, side-effects of cisplatin and its role as chemo sensitizing agent. The plan for weekly cisplatin for x5 doses along with radiation treatment.  Some of the short term side-effects included, though not limited to, including weight loss, life threatening infections, risk of allergic reactions, need for transfusions of blood products, nausea, vomiting, change in bowel habits, loss of hair, admission to hospital for various reasons, and risks of death.   Long term side-effects are also discussed including risks of infertility, permanent damage to nerve function, hearing loss, chronic fatigue, kidney damage with possibility needing hemodialysis, and rare secondary malignancy including bone marrow disorders.  After much discussion, she will call us on Monday after her appointment to see Dr. Denman George If she were to proceed with surgery, we will hold off further treatment here until after surgery However, if she is not a candidate for upfront surgery, I will arrange for port placement, chemo education class, blood work and return visit for future treatment I have addressed all her questions and concerns

## 2021-07-01 NOTE — Progress Notes (Signed)
[place] CONSULT NOTE  Patient Care Team: Pcp, No as PCP - General  ASSESSMENT & PLAN:  Cervical cancer ([place]) We have extensive discussions about the approach and management of cervical cancer She has an appointment on Monday to see Dr. Denman George for second opinion for evaluation whether she would be a candidate for upfront surgery We expressed concern about her body habitus and prior surgery that might make upfront surgery prohibitive We discussed the role of concurrent chemoradiation therapy with weekly cisplatin We discussed the role of chemotherapy. The intent is of curative intent.  We discussed some of the risks, benefits, side-effects of cisplatin and its role as chemo sensitizing agent. The plan for weekly cisplatin for x5 doses along with radiation treatment.  Some of the short term side-effects included, though not limited to, including weight loss, life threatening infections, risk of allergic reactions, need for transfusions of blood products, nausea, vomiting, change in bowel habits, loss of hair, admission to hospital for various reasons, and risks of death.   Long term side-effects are also discussed including risks of infertility, permanent damage to nerve function, hearing loss, chronic fatigue, kidney damage with possibility needing hemodialysis, and rare secondary malignancy including bone marrow disorders.  After much discussion, she will call us on Monday after her appointment to see Dr. Denman George If she were to proceed with surgery, we will hold off further treatment here until after surgery However, if she is not a candidate for upfront surgery, I will arrange for port placement, chemo education class, blood work and return visit for future treatment I have addressed all her questions and concerns  No orders of the defined types were placed in this encounter.   The total time spent in the appointment was 60 minutes encounter with patients including review  of chart and various tests results, discussions about plan of care and coordination of care plan   All questions were answered. The patient knows to call the clinic with any problems, questions or concerns. No barriers to learning was detected.  Heath Lark, MD 11/25/20224:51 PM  CHIEF COMPLAINTS/PURPOSE OF CONSULTATION:  Cervical cancer, for further evaluation  HISTORY OF PRESENTING ILLNESS:  Maria Davidson 40 y.o. female is here because of recent diagnosis of cervical cancer She is here accompanied by her significant other, Shanon Brow She has 2 adult children and grandchildren Her mother had history of uterine cancer and is a cancer survivor under the care of Dr. Denman George She has appointment to see Dr. Denman George next Monday for second opinion She was evaluated by Dr. Berline Lopes and Dr. [name] for management of cervical cancer Currently, she denies abnormal bleeding, vaginal discharge or pelvic pain She feels healthy No recent changes in bowel habits, urinary difficulties or weight loss  I have reviewed her chart and materials related to her cancer extensively and collaborated history with the patient. Summary of oncologic history is as follows: Oncology History  Cervical cancer ([place])  01/05/2021 Initial Diagnosis   In June, she had a period for 3-6 days with significant back pain and pelvic cramping.  This was more bleeding than she had normally had    06/07/2021 Pathology Results   [id number] Cervical biopsy showed adenocarcinoma    06/09/2021 Procedure   Patient underwent colposcopy on 11/3.  Findings at that time was a fleshy, friable mass occupying most of the cervix measuring 3 x 3 cm with polypoid protrusions.  Cervical os unable to be identified.  Biopsies were taken that revealed at least adenocarcinoma  in situ.   06/15/2021 Initial Diagnosis   Cervical cancer ([place])   06/24/2021 PET scan   1. Ill-defined soft tissue and hypermetabolic activity at the expected area of the cervix, compatible with  primary malignancy. 2. No evidence of FDG avid metastatic disease in the chest, abdomen or pelvis. 3. Hypermetabolic activity of the left ovary, likely physiologic.     MEDICAL HISTORY:  Past Medical History:  Diagnosis Date   Obesity, Class III, BMI 40-49.9 (morbid obesity) (HCC)    Ovarian tumor (benign) 12/06/1999    SURGICAL HISTORY: Past Surgical History:  Procedure Laterality Date   LAPAROSCOPIC UNILATERAL SALPINGO OOPHERECTOMY     right side - lap    SOCIAL HISTORY: Social History   Socioeconomic History   Marital status: Divorced    Spouse name: Not on file   Number of children: 2   Years of education: Not on file   Highest education level: Not on file  Occupational History   Occupation: [employer]  Tobacco Use   Smoking status: Former   Smokeless tobacco: Never  Substance and Sexual Activity   Alcohol use: Yes    Comment: occ   Drug use: No   Sexual activity: Yes    Birth control/protection: None  Other Topics Concern   Not on file  Social History Narrative   Not on file   Social Determinants of Health   Financial Resource Strain: Not on file  Food Insecurity: Not on file  Transportation Needs: Not on file  Physical Activity: Not on file  Stress: Not on file  Social Connections: Not on file  Intimate Partner Violence: Not on file    FAMILY HISTORY: Family History  Problem Relation Age of Onset   Cancer Mother 11       uterine cancer   Colon cancer Neg Hx    Breast cancer Neg Hx    Ovarian cancer Neg Hx    Endometrial cancer Neg Hx    Pancreatic cancer Neg Hx    Prostate cancer Neg Hx     ALLERGIES:  is allergic to sulfa antibiotics.  MEDICATIONS:  No current outpatient medications on file.   No current facility-administered medications for this visit.    REVIEW OF SYSTEMS:   Constitutional: Denies fevers, chills or abnormal night sweats Eyes: Denies blurriness of vision, double vision or watery eyes Ears, nose, mouth, throat, and  face: Denies mucositis or sore throat Respiratory: Denies cough, dyspnea or wheezes Cardiovascular: Denies palpitation, chest discomfort or lower extremity swelling Gastrointestinal:  Denies nausea, heartburn or change in bowel habits Skin: Denies abnormal skin rashes Lymphatics: Denies new lymphadenopathy or easy bruising Neurological:Denies numbness, tingling or new weaknesses Behavioral/Psych: Mood is stable, no new changes  All other systems were reviewed with the patient and are negative.  PHYSICAL EXAMINATION: ECOG PERFORMANCE STATUS: 0 - Asymptomatic  Vitals:   07/01/21 1350  BP: (!) 153/76  Pulse: 93  Resp: 18  Temp: 97.7 F (36.5 C)  SpO2: 100%   Filed Weights   07/01/21 1350  Weight: 296 lb (134.3 kg)    GENERAL:alert, no distress and comfortable SKIN: skin color, texture, turgor are normal, no rashes or significant lesions EYES: normal, conjunctiva are pink and non-injected, sclera clear OROPHARYNX:no exudate, no erythema and lips, buccal mucosa, and tongue normal  NECK: supple, thyroid normal size, non-tender, without nodularity LYMPH:  no palpable lymphadenopathy in the cervical, axillary or inguinal LUNGS: clear to auscultation and percussion with normal breathing effort HEART: regular rate &  rhythm and no murmurs and no lower extremity edema ABDOMEN:abdomen soft, non-tender and normal bowel sounds Musculoskeletal:no cyanosis of digits and no clubbing  PSYCH: alert & oriented x 3 with fluent speech NEURO: no focal motor/sensory deficits  LABORATORY DATA:  I have reviewed the data as listed Lab Results  Component Value Date   WBC 8.8 06/21/2008   HGB 13.3 07/27/2009   HCT 39.0 07/27/2009   MCV 87.3 06/21/2008   PLT 254 06/21/2008   No results for input(s): NA, K, CL, CO2, GLUCOSE, BUN, CREATININE, CALCIUM, GFRNONAA, GFRAA, PROT, ALBUMIN, AST, ALT, ALKPHOS, BILITOT, BILIDIR, IBILI in the last 8760 hours.  RADIOGRAPHIC STUDIES: I have personally  reviewed the radiological images as listed and agreed with the findings in the report. NM PET Image Initial (PI) Skull Base To Thigh (F-18 FDG)  Result Date: 06/24/2021 CLINICAL DATA:  Initial treatment strategy for cervical cancer. EXAM: NUCLEAR MEDICINE PET SKULL BASE TO THIGH TECHNIQUE: 15.2 mCi F-18 FDG was injected intravenously. Full-ring PET imaging was performed from the skull base to thigh after the radiotracer. CT data was obtained and used for attenuation correction and anatomic localization. Fasting blood glucose: 96 mg/dl COMPARISON:  CT abdomen and pelvis dated June 14, 2021 FINDINGS: Mediastinal blood pool activity: SUV max 2.9 Liver activity: SUV max 3.9 NECK: No hypermetabolic lymph nodes in the neck. Incidental CT findings: none CHEST: No hypermetabolic mediastinal or hilar nodes. No suspicious pulmonary nodules on the CT scan. Incidental CT findings: none ABDOMEN/PELVIS: Ill-defined soft tissue and hypermetabolic activity at the expected area of the cervix with an SUV max 20. Hypermetabolic activity of the left ovary, SUV max of 12.5. No abnormal hypermetabolic activity within the liver, pancreas, adrenal glands, or spleen. No hypermetabolic lymph nodes in the abdomen or pelvis. Incidental CT findings: none SKELETON: No focal hypermetabolic activity to suggest skeletal metastasis. Incidental CT findings: none IMPRESSION: 1. Ill-defined soft tissue and hypermetabolic activity at the expected area of the cervix, compatible with primary malignancy. 2. No evidence of FDG avid metastatic disease in the chest, abdomen or pelvis. 3. Hypermetabolic activity of the left ovary, likely physiologic. Electronically Signed   By: Yetta Glassman M.D.   On: 06/24/2021 16:26

## 2021-07-04 ENCOUNTER — Telehealth: Payer: Self-pay | Admitting: Oncology

## 2021-07-04 NOTE — Telephone Encounter (Signed)
Chenel called and said she had her second opinion with Dr. Denman George today.  She has decided on treatment at [facility] with radiation and chemotherapy.  She is wondering if Dr. Alvy Bimler can call her to discuss this.    She is ok with scheduling all appointments but is requesting early morning appointments if possible for radiation.  Reviewed upcoming appointment tomorrow for CT SIM.

## 2021-07-05 ENCOUNTER — Ambulatory Visit
Admission: RE | Admit: 2021-07-05 | Discharge: 2021-07-05 | Disposition: A | Discharge status: Home / Self Care | Payer: Commercial Managed Care - PPO | Source: Ambulatory Visit | Attending: Radiation Oncology | Admitting: Radiation Oncology

## 2021-07-05 ENCOUNTER — Other Ambulatory Visit: Payer: Self-pay

## 2021-07-05 ENCOUNTER — Other Ambulatory Visit: Payer: Self-pay | Admitting: Hematology and Oncology

## 2021-07-05 DIAGNOSIS — C538 Malignant neoplasm of overlapping sites of cervix uteri: Secondary | ICD-10-CM

## 2021-07-05 DIAGNOSIS — C539 Malignant neoplasm of cervix uteri, unspecified: Secondary | ICD-10-CM | POA: Insufficient documentation

## 2021-07-05 NOTE — Telephone Encounter (Signed)
Called Maria Davidson and advised her that Dr. Alvy Bimler has put in the order for chemotherapy and that the schedulers will call her with appointments for patient education and chemotherapy.  Discussed to plan on starting chemo on 07/15/21.    Also discussed need for phone call from Dr. Alvy Bimler.  Sondos said she just wanted to make sure Dr. Alvy Bimler was aware of her decision for treatment.  Advised her that she has been notified.  Also advised her of port placement appointment on 07/12/21 with 8 am arrival, NPO p midnight except sips of water with am meds and that she will need a driver.  She verbalized understanding and agreement of instructions.  Encouraged her to call with any questions or needs.

## 2021-07-05 NOTE — Progress Notes (Signed)
START OFF PATHWAY REGIMEN - Other   [id number]:Cisplatin 35 mg/m2 q7 Days + RT:   A cycle is every 7 days:     Cisplatin   **Always confirm dose/schedule in your pharmacy ordering system**  Patient Characteristics: Intent of Therapy: Curative Intent, Discussed with Patient 

## 2021-07-06 ENCOUNTER — Telehealth: Payer: Self-pay | Admitting: Oncology

## 2021-07-06 NOTE — Telephone Encounter (Signed)
Maria Davidson called and asked about her chemotherapy appointments. Advised her of all upcoming appointments.  She verbalized understanding and agreement.

## 2021-07-08 ENCOUNTER — Telehealth: Payer: Self-pay | Admitting: Oncology

## 2021-07-08 ENCOUNTER — Encounter: Payer: Self-pay | Admitting: Hematology and Oncology

## 2021-07-08 NOTE — Progress Notes (Signed)
Pharmacist Chemotherapy Monitoring - Initial Assessment    Anticipated start date: 07/15/21   The following has been reviewed per standard work regarding the patient's treatment regimen: The patient's diagnosis, treatment plan and drug doses, and organ/hematologic function Lab orders and baseline tests specific to treatment regimen  The treatment plan start date, drug sequencing, and pre-medications Prior authorization status  Patient's documented medication list, including drug-drug interaction screen and prescriptions for anti-emetics and supportive care specific to the treatment regimen The drug concentrations, fluid compatibility, administration routes, and timing of the medications to be used The patient's access for treatment and lifetime cumulative dose history, if applicable  The patient's medication allergies and previous infusion related reactions, if applicable   Changes made to treatment plan:  N/A  Follow up needed:  Pending authorization for treatment  Needs home antiemetics. Will f/u CMP.    Kennith Center, Pharm.D., CPP 07/08/2021@3 :14 PM

## 2021-07-08 NOTE — Telephone Encounter (Signed)
No exceptions that I can override; the charge nurse in in charge, ask Threasa Beards directly

## 2021-07-08 NOTE — Telephone Encounter (Signed)
Maria Davidson called and asked if she can have a family member with her in the infusion room for her first chemotherapy.  Explained that due to cancer center policy for Covid, only patients that have dementia or behavioral issues can have a family member with them in the infusion room.  Maria Davidson asked if we could check with Dr. Alvy Bimler to see if she can approve her to have a family member with her just for her first infusion.

## 2021-07-08 NOTE — Telephone Encounter (Signed)
Called Maria Davidson back and advised her that she has been approved by Threasa Beards - Editor, commissioning to have a visitor with her during her infusion.  Advised her that she will be in one of the beds.  She was very appreciative and verbalized understanding.

## 2021-07-11 ENCOUNTER — Telehealth: Payer: Self-pay | Admitting: Oncology

## 2021-07-11 ENCOUNTER — Other Ambulatory Visit: Payer: Self-pay | Admitting: Radiology

## 2021-07-11 NOTE — Telephone Encounter (Signed)
Deborah called back and has decided not to do chemotherapy.  She is just going to do radiation.  Advised her that we will cancel all her chemotherapy appointments and her port placement tomorrow.  She verbalized understanding and agreement.

## 2021-07-11 NOTE — Telephone Encounter (Signed)
Maria Davidson called and said she would like to talk to Dr. Alvy Bimler because she just wants to have radiation not chemotherapy.  She would like to discuss this with Dr. Alvy Bimler before her port appointment tomorrow.  Discussed that chemotherapy helps the radiation to work better.  She said that she is aware of that but has been doing research that shows that radiation can be effective by itself. Advised her that I will check with Dr. Alvy Bimler and call her back.

## 2021-07-11 NOTE — Telephone Encounter (Signed)
Called Maria Davidson back and advised her that I have discussed radiation only with Dr. Alvy Bimler and Dr. [name].  Discussed her best chance of cure is with chemoradiation but we understand if she would like radiation only.  Maria Davidson said she is very anxious about her port placement tomorrow and that is what is making her question chemotherapy. She is going to think about it and call back this afternoon with her final decision.

## 2021-07-11 NOTE — Telephone Encounter (Signed)
I have cancelled all her follow-up and infusion

## 2021-07-11 NOTE — Telephone Encounter (Signed)
I am overbooked with 21 patients, not including hospital patients and I am on call I do not see any opening today to discuss this with her today If she does not want chemo, we can cancel her chemo appt, port, chemo ed and appointment to see me

## 2021-07-12 ENCOUNTER — Ambulatory Visit (HOSPITAL_COMMUNITY): Admission: RE | Admit: 2021-07-12 | Payer: Commercial Managed Care - PPO | Source: Ambulatory Visit

## 2021-07-12 DIAGNOSIS — R300 Dysuria: Secondary | ICD-10-CM | POA: Insufficient documentation

## 2021-07-12 DIAGNOSIS — C538 Malignant neoplasm of overlapping sites of cervix uteri: Secondary | ICD-10-CM | POA: Insufficient documentation

## 2021-07-13 ENCOUNTER — Other Ambulatory Visit: Payer: Commercial Managed Care - PPO

## 2021-07-14 ENCOUNTER — Other Ambulatory Visit: Payer: Commercial Managed Care - PPO

## 2021-07-14 ENCOUNTER — Ambulatory Visit
Admission: RE | Admit: 2021-07-14 | Discharge: 2021-07-14 | Disposition: A | Discharge status: Home / Self Care | Payer: Commercial Managed Care - PPO | Source: Ambulatory Visit | Attending: Radiation Oncology | Admitting: Radiation Oncology

## 2021-07-14 ENCOUNTER — Encounter: Payer: Self-pay | Admitting: Hematology and Oncology

## 2021-07-14 ENCOUNTER — Other Ambulatory Visit: Payer: Self-pay

## 2021-07-14 ENCOUNTER — Ambulatory Visit: Payer: Commercial Managed Care - PPO | Admitting: Hematology and Oncology

## 2021-07-14 DIAGNOSIS — C538 Malignant neoplasm of overlapping sites of cervix uteri: Secondary | ICD-10-CM | POA: Diagnosis not present

## 2021-07-15 ENCOUNTER — Ambulatory Visit: Payer: Commercial Managed Care - PPO

## 2021-07-15 ENCOUNTER — Ambulatory Visit
Admission: RE | Admit: 2021-07-15 | Discharge: 2021-07-15 | Disposition: A | Discharge status: Home / Self Care | Payer: Commercial Managed Care - PPO | Source: Ambulatory Visit | Attending: Radiation Oncology | Admitting: Radiation Oncology

## 2021-07-15 ENCOUNTER — Other Ambulatory Visit: Payer: Self-pay

## 2021-07-15 DIAGNOSIS — C538 Malignant neoplasm of overlapping sites of cervix uteri: Secondary | ICD-10-CM | POA: Diagnosis not present

## 2021-07-18 ENCOUNTER — Other Ambulatory Visit: Payer: Self-pay

## 2021-07-18 ENCOUNTER — Ambulatory Visit
Admission: RE | Admit: 2021-07-18 | Discharge: 2021-07-18 | Disposition: A | Discharge status: Home / Self Care | Payer: Commercial Managed Care - PPO | Source: Ambulatory Visit | Attending: Radiation Oncology | Admitting: Radiation Oncology

## 2021-07-18 DIAGNOSIS — C538 Malignant neoplasm of overlapping sites of cervix uteri: Secondary | ICD-10-CM | POA: Diagnosis not present

## 2021-07-19 ENCOUNTER — Ambulatory Visit
Admission: RE | Admit: 2021-07-19 | Discharge: 2021-07-19 | Disposition: A | Discharge status: Home / Self Care | Payer: Commercial Managed Care - PPO | Source: Ambulatory Visit | Attending: Radiation Oncology | Admitting: Radiation Oncology

## 2021-07-19 DIAGNOSIS — C538 Malignant neoplasm of overlapping sites of cervix uteri: Secondary | ICD-10-CM | POA: Diagnosis not present

## 2021-07-20 ENCOUNTER — Ambulatory Visit
Admission: RE | Admit: 2021-07-20 | Discharge: 2021-07-20 | Disposition: A | Discharge status: Home / Self Care | Payer: Commercial Managed Care - PPO | Source: Ambulatory Visit | Attending: Radiation Oncology | Admitting: Radiation Oncology

## 2021-07-20 ENCOUNTER — Other Ambulatory Visit: Payer: Self-pay

## 2021-07-20 DIAGNOSIS — C538 Malignant neoplasm of overlapping sites of cervix uteri: Secondary | ICD-10-CM | POA: Diagnosis not present

## 2021-07-21 ENCOUNTER — Ambulatory Visit
Admission: RE | Admit: 2021-07-21 | Discharge: 2021-07-21 | Disposition: A | Discharge status: Home / Self Care | Payer: Commercial Managed Care - PPO | Source: Ambulatory Visit | Attending: Radiation Oncology | Admitting: Radiation Oncology

## 2021-07-21 DIAGNOSIS — C538 Malignant neoplasm of overlapping sites of cervix uteri: Secondary | ICD-10-CM | POA: Diagnosis not present

## 2021-07-22 ENCOUNTER — Ambulatory Visit: Payer: Commercial Managed Care - PPO

## 2021-07-22 ENCOUNTER — Ambulatory Visit
Admission: RE | Admit: 2021-07-22 | Discharge: 2021-07-22 | Disposition: A | Discharge status: Home / Self Care | Payer: Commercial Managed Care - PPO | Source: Ambulatory Visit | Attending: Radiation Oncology | Admitting: Radiation Oncology

## 2021-07-22 ENCOUNTER — Other Ambulatory Visit: Payer: Self-pay

## 2021-07-22 DIAGNOSIS — C538 Malignant neoplasm of overlapping sites of cervix uteri: Secondary | ICD-10-CM | POA: Diagnosis not present

## 2021-07-25 ENCOUNTER — Ambulatory Visit
Admission: RE | Admit: 2021-07-25 | Discharge: 2021-07-25 | Disposition: A | Discharge status: Home / Self Care | Payer: Commercial Managed Care - PPO | Source: Ambulatory Visit | Attending: Radiation Oncology | Admitting: Radiation Oncology

## 2021-07-25 ENCOUNTER — Other Ambulatory Visit: Payer: Self-pay

## 2021-07-25 DIAGNOSIS — C538 Malignant neoplasm of overlapping sites of cervix uteri: Secondary | ICD-10-CM | POA: Diagnosis not present

## 2021-07-26 ENCOUNTER — Ambulatory Visit
Admission: RE | Admit: 2021-07-26 | Discharge: 2021-07-26 | Disposition: A | Discharge status: Home / Self Care | Payer: Commercial Managed Care - PPO | Source: Ambulatory Visit | Attending: Radiation Oncology | Admitting: Radiation Oncology

## 2021-07-26 DIAGNOSIS — R300 Dysuria: Secondary | ICD-10-CM

## 2021-07-26 DIAGNOSIS — C538 Malignant neoplasm of overlapping sites of cervix uteri: Secondary | ICD-10-CM

## 2021-07-26 LAB — URINALYSIS, COMPLETE (UACMP) WITH MICROSCOPIC
Bacteria, UA: NONE SEEN
Bilirubin Urine: NEGATIVE
Glucose, UA: NEGATIVE mg/dL
Hgb urine dipstick: NEGATIVE
Ketones, ur: NEGATIVE mg/dL
Nitrite: NEGATIVE
Protein, ur: NEGATIVE mg/dL
Specific Gravity, Urine: 1.024 (ref 1.005–1.030)
pH: 5 (ref 5.0–8.0)

## 2021-07-27 ENCOUNTER — Other Ambulatory Visit: Payer: Self-pay

## 2021-07-27 ENCOUNTER — Ambulatory Visit
Admission: RE | Admit: 2021-07-27 | Discharge: 2021-07-27 | Disposition: A | Discharge status: Home / Self Care | Payer: Commercial Managed Care - PPO | Source: Ambulatory Visit | Attending: Radiation Oncology | Admitting: Radiation Oncology

## 2021-07-27 ENCOUNTER — Telehealth: Payer: Self-pay | Admitting: Oncology

## 2021-07-27 DIAGNOSIS — C538 Malignant neoplasm of overlapping sites of cervix uteri: Secondary | ICD-10-CM | POA: Diagnosis not present

## 2021-07-27 LAB — URINE CULTURE

## 2021-07-27 NOTE — Telephone Encounter (Signed)
Maria Davidson called and asked if her urinalysis results are back and if she would need an antibiotic. Advised her that it is resulted and that her urine culture is in process.  Discussed that the results of the culture should be back by tomorrow and that we will call her with the results and any recommendations for an antibiotic.  She verbalized understanding and agreement.

## 2021-07-28 ENCOUNTER — Ambulatory Visit
Admission: RE | Admit: 2021-07-28 | Discharge: 2021-07-28 | Disposition: A | Discharge status: Home / Self Care | Payer: Commercial Managed Care - PPO | Source: Ambulatory Visit | Attending: Radiation Oncology | Admitting: Radiation Oncology

## 2021-07-28 ENCOUNTER — Other Ambulatory Visit: Payer: Self-pay

## 2021-07-28 ENCOUNTER — Ambulatory Visit: Payer: Commercial Managed Care - PPO

## 2021-07-28 ENCOUNTER — Telehealth: Payer: Self-pay | Admitting: Oncology

## 2021-07-28 DIAGNOSIS — C538 Malignant neoplasm of overlapping sites of cervix uteri: Secondary | ICD-10-CM | POA: Diagnosis not present

## 2021-07-28 NOTE — Telephone Encounter (Addendum)
Maria Davidson called back and said she is feeling better.  She is wondering if there are any radiation appointments available this afternoon.   Notified Maria Davidson with Dr. Clabe Seal office.

## 2021-07-28 NOTE — Telephone Encounter (Signed)
Maria Davidson left a message saying that she is sick today (she has a sore throat) and is not going to make it to radiation.    Called and let [facility] know that she is canceling.  Left a message for Maurene letting her know that the radiation appointment will be added on to the end of her treatment.

## 2021-07-29 ENCOUNTER — Ambulatory Visit
Admission: RE | Admit: 2021-07-29 | Discharge: 2021-07-29 | Disposition: A | Discharge status: Home / Self Care | Payer: Commercial Managed Care - PPO | Source: Ambulatory Visit | Attending: Radiation Oncology | Admitting: Radiation Oncology

## 2021-07-29 ENCOUNTER — Ambulatory Visit: Payer: Commercial Managed Care - PPO

## 2021-07-29 DIAGNOSIS — C538 Malignant neoplasm of overlapping sites of cervix uteri: Secondary | ICD-10-CM | POA: Diagnosis not present

## 2021-08-02 ENCOUNTER — Ambulatory Visit
Admission: RE | Admit: 2021-08-02 | Discharge: 2021-08-02 | Disposition: A | Discharge status: Home / Self Care | Payer: Commercial Managed Care - PPO | Source: Ambulatory Visit | Attending: Radiation Oncology | Admitting: Radiation Oncology

## 2021-08-02 ENCOUNTER — Other Ambulatory Visit: Payer: Self-pay

## 2021-08-02 DIAGNOSIS — C538 Malignant neoplasm of overlapping sites of cervix uteri: Secondary | ICD-10-CM

## 2021-08-02 DIAGNOSIS — R300 Dysuria: Secondary | ICD-10-CM

## 2021-08-03 ENCOUNTER — Ambulatory Visit
Admission: RE | Admit: 2021-08-03 | Discharge: 2021-08-03 | Disposition: A | Discharge status: Home / Self Care | Payer: Commercial Managed Care - PPO | Source: Ambulatory Visit | Attending: Radiation Oncology | Admitting: Radiation Oncology

## 2021-08-03 ENCOUNTER — Other Ambulatory Visit: Payer: Self-pay | Admitting: Radiology

## 2021-08-03 DIAGNOSIS — C538 Malignant neoplasm of overlapping sites of cervix uteri: Secondary | ICD-10-CM

## 2021-08-03 DIAGNOSIS — R300 Dysuria: Secondary | ICD-10-CM

## 2021-08-04 ENCOUNTER — Other Ambulatory Visit: Payer: Self-pay

## 2021-08-04 ENCOUNTER — Ambulatory Visit
Admission: RE | Admit: 2021-08-04 | Discharge: 2021-08-04 | Disposition: A | Discharge status: Home / Self Care | Payer: Commercial Managed Care - PPO | Source: Ambulatory Visit | Attending: Radiation Oncology | Admitting: Radiation Oncology

## 2021-08-04 DIAGNOSIS — C538 Malignant neoplasm of overlapping sites of cervix uteri: Secondary | ICD-10-CM | POA: Diagnosis not present

## 2021-08-04 LAB — URINE CULTURE

## 2021-08-05 ENCOUNTER — Ambulatory Visit: Payer: Commercial Managed Care - PPO

## 2021-08-05 ENCOUNTER — Ambulatory Visit
Admission: RE | Admit: 2021-08-05 | Discharge: 2021-08-05 | Disposition: A | Discharge status: Home / Self Care | Payer: Commercial Managed Care - PPO | Source: Ambulatory Visit | Attending: Radiation Oncology | Admitting: Radiation Oncology

## 2021-08-05 DIAGNOSIS — C538 Malignant neoplasm of overlapping sites of cervix uteri: Secondary | ICD-10-CM | POA: Diagnosis not present

## 2021-08-09 ENCOUNTER — Ambulatory Visit
Admission: RE | Admit: 2021-08-09 | Discharge: 2021-08-09 | Disposition: A | Discharge status: Home / Self Care | Payer: Commercial Managed Care - PPO | Source: Ambulatory Visit | Attending: Radiation Oncology | Admitting: Radiation Oncology

## 2021-08-09 ENCOUNTER — Other Ambulatory Visit: Payer: Self-pay

## 2021-08-09 DIAGNOSIS — Z6841 Body Mass Index (BMI) 40.0 and over, adult: Secondary | ICD-10-CM | POA: Diagnosis not present

## 2021-08-09 DIAGNOSIS — C538 Malignant neoplasm of overlapping sites of cervix uteri: Secondary | ICD-10-CM | POA: Diagnosis present

## 2021-08-09 DIAGNOSIS — R197 Diarrhea, unspecified: Secondary | ICD-10-CM | POA: Diagnosis not present

## 2021-08-09 DIAGNOSIS — Z923 Personal history of irradiation: Secondary | ICD-10-CM | POA: Diagnosis not present

## 2021-08-09 DIAGNOSIS — R3915 Urgency of urination: Secondary | ICD-10-CM | POA: Diagnosis not present

## 2021-08-10 ENCOUNTER — Ambulatory Visit
Admission: RE | Admit: 2021-08-10 | Discharge: 2021-08-10 | Disposition: A | Discharge status: Home / Self Care | Payer: Commercial Managed Care - PPO | Source: Ambulatory Visit | Attending: Radiation Oncology | Admitting: Radiation Oncology

## 2021-08-10 ENCOUNTER — Other Ambulatory Visit: Payer: Self-pay | Admitting: Radiation Oncology

## 2021-08-10 ENCOUNTER — Ambulatory Visit: Payer: Commercial Managed Care - PPO

## 2021-08-10 DIAGNOSIS — C538 Malignant neoplasm of overlapping sites of cervix uteri: Secondary | ICD-10-CM | POA: Diagnosis not present

## 2021-08-11 ENCOUNTER — Ambulatory Visit
Admission: RE | Admit: 2021-08-11 | Discharge: 2021-08-11 | Disposition: A | Discharge status: Home / Self Care | Payer: Commercial Managed Care - PPO | Source: Ambulatory Visit | Attending: Radiation Oncology | Admitting: Radiation Oncology

## 2021-08-11 ENCOUNTER — Other Ambulatory Visit: Payer: Self-pay

## 2021-08-11 ENCOUNTER — Other Ambulatory Visit: Payer: Self-pay | Admitting: Hematology and Oncology

## 2021-08-11 DIAGNOSIS — C538 Malignant neoplasm of overlapping sites of cervix uteri: Secondary | ICD-10-CM | POA: Diagnosis not present

## 2021-08-11 LAB — CBC WITH DIFFERENTIAL (CANCER CENTER ONLY)
Abs Immature Granulocytes: 0.02 10*3/uL (ref 0.00–0.07)
Basophils Absolute: 0 10*3/uL (ref 0.0–0.1)
Basophils Relative: 0 %
Eosinophils Absolute: 0 10*3/uL (ref 0.0–0.5)
Eosinophils Relative: 1 %
HCT: 35.4 % — ABNORMAL LOW (ref 36.0–46.0)
Hemoglobin: 11.7 g/dL — ABNORMAL LOW (ref 12.0–15.0)
Immature Granulocytes: 1 %
Lymphocytes Relative: 17 %
Lymphs Abs: 0.5 10*3/uL — ABNORMAL LOW (ref 0.7–4.0)
MCH: 28.1 pg (ref 26.0–34.0)
MCHC: 33.1 g/dL (ref 30.0–36.0)
MCV: 85.1 fL (ref 80.0–100.0)
Monocytes Absolute: 0.2 10*3/uL (ref 0.1–1.0)
Monocytes Relative: 7 %
Neutro Abs: 2.4 10*3/uL (ref 1.7–7.7)
Neutrophils Relative %: 74 %
Platelet Count: 171 10*3/uL (ref 150–400)
RBC: 4.16 MIL/uL (ref 3.87–5.11)
RDW: 15.4 % (ref 11.5–15.5)
WBC Count: 3.3 10*3/uL — ABNORMAL LOW (ref 4.0–10.5)
nRBC: 0 % (ref 0.0–0.2)

## 2021-08-11 LAB — BASIC METABOLIC PANEL - CANCER CENTER ONLY
Anion gap: 7 (ref 5–15)
BUN: 9 mg/dL (ref 6–20)
CO2: 27 mmol/L (ref 22–32)
Calcium: 8.6 mg/dL — ABNORMAL LOW (ref 8.9–10.3)
Chloride: 105 mmol/L (ref 98–111)
Creatinine: 0.63 mg/dL (ref 0.44–1.00)
GFR, Estimated: >60 mL/min (ref >60)
Glucose, Bld: 96 mg/dL (ref 70–99)
Potassium: 4 mmol/L (ref 3.5–5.1)
Sodium: 139 mmol/L (ref 135–145)

## 2021-08-11 LAB — MAGNESIUM: Magnesium: 1.8 mg/dL (ref 1.7–2.4)

## 2021-08-12 ENCOUNTER — Ambulatory Visit: Payer: Commercial Managed Care - PPO

## 2021-08-12 ENCOUNTER — Ambulatory Visit
Admission: RE | Admit: 2021-08-12 | Discharge: 2021-08-12 | Disposition: A | Discharge status: Home / Self Care | Payer: Commercial Managed Care - PPO | Source: Ambulatory Visit | Attending: Radiation Oncology | Admitting: Radiation Oncology

## 2021-08-12 DIAGNOSIS — C538 Malignant neoplasm of overlapping sites of cervix uteri: Secondary | ICD-10-CM | POA: Diagnosis not present

## 2021-08-15 ENCOUNTER — Other Ambulatory Visit: Payer: Self-pay

## 2021-08-15 ENCOUNTER — Telehealth: Payer: Self-pay | Admitting: *Deleted

## 2021-08-15 ENCOUNTER — Ambulatory Visit
Admission: RE | Admit: 2021-08-15 | Discharge: 2021-08-15 | Disposition: A | Discharge status: Home / Self Care | Payer: Commercial Managed Care - PPO | Source: Ambulatory Visit | Attending: Radiation Oncology | Admitting: Radiation Oncology

## 2021-08-15 ENCOUNTER — Telehealth: Payer: Self-pay | Admitting: Oncology

## 2021-08-15 DIAGNOSIS — C538 Malignant neoplasm of overlapping sites of cervix uteri: Secondary | ICD-10-CM | POA: Diagnosis not present

## 2021-08-15 NOTE — Telephone Encounter (Signed)
Spoke with the patient and scheduled an appt for this Friday morning at 8:30 am.

## 2021-08-15 NOTE — Telephone Encounter (Signed)
Kelsei called and asked about an appointment with Dr. Berline Lopes.  Advised her that we will call her back today with any appointment.

## 2021-08-16 ENCOUNTER — Ambulatory Visit
Admission: RE | Admit: 2021-08-16 | Discharge: 2021-08-16 | Disposition: A | Discharge status: Home / Self Care | Payer: Commercial Managed Care - PPO | Source: Ambulatory Visit | Attending: Radiation Oncology | Admitting: Radiation Oncology

## 2021-08-16 DIAGNOSIS — C538 Malignant neoplasm of overlapping sites of cervix uteri: Secondary | ICD-10-CM | POA: Diagnosis not present

## 2021-08-17 ENCOUNTER — Other Ambulatory Visit: Payer: Self-pay

## 2021-08-17 ENCOUNTER — Ambulatory Visit
Admission: RE | Admit: 2021-08-17 | Discharge: 2021-08-17 | Disposition: A | Discharge status: Home / Self Care | Payer: Commercial Managed Care - PPO | Source: Ambulatory Visit | Attending: Radiation Oncology | Admitting: Radiation Oncology

## 2021-08-17 DIAGNOSIS — C538 Malignant neoplasm of overlapping sites of cervix uteri: Secondary | ICD-10-CM | POA: Diagnosis not present

## 2021-08-18 ENCOUNTER — Encounter: Payer: Self-pay | Admitting: Gynecologic Oncology

## 2021-08-18 ENCOUNTER — Ambulatory Visit
Admission: RE | Admit: 2021-08-18 | Discharge: 2021-08-18 | Disposition: A | Discharge status: Home / Self Care | Payer: Commercial Managed Care - PPO | Source: Ambulatory Visit | Attending: Radiation Oncology | Admitting: Radiation Oncology

## 2021-08-18 DIAGNOSIS — C538 Malignant neoplasm of overlapping sites of cervix uteri: Secondary | ICD-10-CM | POA: Diagnosis not present

## 2021-08-19 ENCOUNTER — Ambulatory Visit: Payer: Commercial Managed Care - PPO

## 2021-08-19 ENCOUNTER — Inpatient Hospital Stay: Payer: Commercial Managed Care - PPO | Attending: Gynecologic Oncology | Admitting: Gynecologic Oncology

## 2021-08-19 ENCOUNTER — Other Ambulatory Visit: Payer: Self-pay

## 2021-08-19 ENCOUNTER — Encounter: Payer: Self-pay | Admitting: Gynecologic Oncology

## 2021-08-19 ENCOUNTER — Ambulatory Visit
Admission: RE | Admit: 2021-08-19 | Discharge: 2021-08-19 | Disposition: A | Discharge status: Home / Self Care | Payer: Commercial Managed Care - PPO | Source: Ambulatory Visit | Attending: Radiation Oncology | Admitting: Radiation Oncology

## 2021-08-19 VITALS — BP 126/89 | HR 91 | Temp 98.3°F | Resp 18 | Ht 67.0 in | Wt 292.0 lb

## 2021-08-19 DIAGNOSIS — Z923 Personal history of irradiation: Secondary | ICD-10-CM | POA: Insufficient documentation

## 2021-08-19 DIAGNOSIS — C539 Malignant neoplasm of cervix uteri, unspecified: Secondary | ICD-10-CM | POA: Diagnosis not present

## 2021-08-19 DIAGNOSIS — Z6841 Body Mass Index (BMI) 40.0 and over, adult: Secondary | ICD-10-CM

## 2021-08-19 DIAGNOSIS — C538 Malignant neoplasm of overlapping sites of cervix uteri: Secondary | ICD-10-CM | POA: Insufficient documentation

## 2021-08-19 DIAGNOSIS — R197 Diarrhea, unspecified: Secondary | ICD-10-CM | POA: Insufficient documentation

## 2021-08-19 DIAGNOSIS — R3915 Urgency of urination: Secondary | ICD-10-CM | POA: Insufficient documentation

## 2021-08-19 NOTE — Patient Instructions (Signed)
You should be hearing from Dr. Clabe Seal office to arrange for the brachytherapy treatment. Once the date for this procedure has been determined, we will schedule surgery and have you come to the office for a pre-operative appointment with Genean Adamski to discuss the details of surgery. Please call the office for any questions or concerns.

## 2021-08-19 NOTE — Progress Notes (Signed)
Gynecologic Oncology Return Clinic Visit  08/19/2021  Reason for Visit: Follow-up after completion of pelvic radiation  Treatment History: Oncology History  Cervical cancer (Maria Davidson)  01/05/2021 Initial Diagnosis   In June, she had a period for 3-6 days with significant back pain and pelvic cramping.  This was more bleeding than she had normally had    06/07/2021 Pathology Results   [id number] Cervical biopsy showed adenocarcinoma    06/09/2021 Procedure   Patient underwent colposcopy on 11/3.  Findings at that time was a fleshy, friable mass occupying most of the cervix measuring 3 x 3 cm with polypoid protrusions.  Cervical os unable to be identified.  Biopsies were taken that revealed at least adenocarcinoma in situ.   06/15/2021 Initial Diagnosis   Cervical cancer (Maria Davidson)   06/24/2021 PET scan   1. Ill-defined soft tissue and hypermetabolic activity at the expected area of the cervix, compatible with primary malignancy. 2. No evidence of FDG avid metastatic disease in the chest, abdomen or pelvis. 3. Hypermetabolic activity of the left ovary, likely physiologic.   07/01/2021 Cancer Staging   Staging form: Cervix Uteri, AJCC Version 9 - Clinical stage from 07/01/2021: FIGO Stage IB3 (cT1b3, cN0, cM0) - Signed by Heath Lark, MD on 07/01/2021 Stage prefix: Initial diagnosis    07/15/2021 - 07/15/2021 Chemotherapy   Patient is on Treatment Plan : BLADDER Cisplatin q7d + XRT      Interval History: Patient presents today for follow-up.  She completed IMRT recently.  She notes overall doing well during treatment.  She has had some diarrhea, which has improved since completing treatment.  She notes some urinary urgency as well as vaginal and vulvar burning, not just when she voids, that has almost resolved now.  Last week, she had bleeding that was similar to menstrual-like bleeding although not nearly as heavy.  She had some passage of golf ball sized clots during this time.  Now, she notes some  light bright red bleeding when she wipes, denies any discharge.  Notes her appetite is okay, had some nausea in the beginning of treatment and emesis for a couple of days, but this has resolved.  Denies any current abdominal or pelvic pain.  Past Medical/Surgical History: Past Medical History:  Diagnosis Date   Obesity, Class III, BMI 40-49.9 (morbid obesity) (HCC)    Ovarian tumor (benign) 12/06/1999    Past Surgical History:  Procedure Laterality Date   LAPAROSCOPIC UNILATERAL SALPINGO OOPHERECTOMY     right side - lap    Family History  Problem Relation Age of Onset   Cancer Mother 59       uterine cancer   Colon cancer Neg Hx    Breast cancer Neg Hx    Ovarian cancer Neg Hx    Endometrial cancer Neg Hx    Pancreatic cancer Neg Hx    Prostate cancer Neg Hx     Social History   Socioeconomic History   Marital status: Divorced    Spouse name: Not on file   Number of children: 2   Years of education: Not on file   Highest education level: Not on file  Occupational History   Occupation: [employer]  Tobacco Use   Smoking status: Former   Smokeless tobacco: Never  Substance and Sexual Activity   Alcohol use: Yes    Comment: occ   Drug use: No   Sexual activity: Yes    Birth control/protection: None  Other Topics Concern   Not on file  Social History Narrative   Not on file   Social Determinants of Health   Financial Resource Strain: Not on file  Food Insecurity: Not on file  Transportation Needs: Not on file  Physical Activity: Not on file  Stress: Not on file  Social Connections: Not on file    Current Medications: No current outpatient medications on file.  Review of Systems: Pertinent positives include diarrhea, urinary frequency, vaginal bleeding. Denies appetite changes, fevers, chills, fatigue, unexplained weight changes. Denies hearing loss, neck lumps or masses, mouth sores, ringing in ears or voice changes. Denies cough or wheezing.  Denies  shortness of breath. Denies chest pain or palpitations. Denies leg swelling. Denies abdominal distention, pain, blood in stools, constipation, nausea, vomiting, or early satiety. Denies pain with intercourse, dysuria, hematuria or incontinence. Denies hot flashes, pelvic pain,  or vaginal discharge.   Denies joint pain, back pain or muscle pain/cramps. Denies itching, rash, or wounds. Denies dizziness, headaches, numbness or seizures. Denies swollen lymph nodes or glands, denies easy bruising or bleeding. Denies anxiety, depression, confusion, or decreased concentration.  Physical Exam: BP 126/89 (BP Location: Left Arm, Patient Position: Sitting)    Pulse 91    Temp 98.3 F (36.8 C) (Tympanic)    Resp 18    Ht 5\' 7"  (1.702 m)    Wt 292 lb (132.5 kg)    SpO2 100%    BMI 45.73 kg/m  General: Alert, oriented, no acute distress.  HEENT: Normocephalic, atraumatic. Sclera anicteric.  Chest: Clear to auscultation bilaterally. No wheezes, rhonchi, or rales. Cardiovascular: Regular rate and rhythm, no murmurs, rubs, or gallops.  Abdomen: Obese. Normoactive bowel sounds. Soft, nondistended, nontender to palpation. No masses or hepatosplenomegaly appreciated. No palpable fluid wave.  Well-healed midline incision. Extremities: Grossly normal range of motion. Warm, well perfused. No edema bilaterally.  Skin: No rashes or lesions.  Lymphatics: No cervical, supraclavicular, or inguinal adenopathy.  GU: Normal appearing external genitalia without erythema, excoriation, or lesions.  Speculum exam reveals well rugated vaginal mucosa, no masses or lesions.  Cervix has a 2-3 central area within the os that appears consistent with likely some residual tumor, overall significantly decreased in size since her exam prior to treatment.  There is visibly normal cervix entirely around this area.  On bimanual exam, the cervix is mildly firm, but otherwise normal circumferentially.  No parametrial invasion noted on  rectovaginal exam.  Laboratory & Radiologic Studies: None new  Assessment & Plan: Maria Davidson is a 41 y.o. woman with Stage IB3 adenocarcinoma of the cervix who presents for treatment planning after completing pelvic radiation.  Patient has done well and is recovering from side effects of radiation.  She has had good response with tumor shrinkage after pelvic radiation but continues to have evidence of central tumor.  I believe that she is a good candidate for interval extrafascial hysterectomy.  I recommend that she proceeds with 1 HDR treatment and we will plan on a 4-6-week posttreatment extrafascial hysterectomy.  We discussed that surgery would entail total hysterectomy with removal of remaining fallopian tube.  She had 1 fallopian tube and ovary already removed.  We discussed risks and benefits of removal of the other ovary.  There is an increased risk of ovarian metastases that is greater in adenocarcinoma compared to squamous cell cervix cancer.  This risk is less than 5% in patients with stage Ib disease.  Given her age, it would be reasonable to remove this ovary given low but increased risk of ovarian  metastases in the setting of adenocarcinoma and it would be reasonable to preserve the ovary to retain some endogenous hormone function.  The patient will think about what she would like to do and let me know either the morning of surgery or prior to surgery what she decides about removal of the remaining ovary.  35 minutes of total time was spent for this patient encounter, including preparation, face-to-face counseling with the patient and coordination of care, and documentation of the encounter.  Jeral Pinch, MD  Division of Gynecologic Oncology  Department of Obstetrics and Gynecology  [facility]

## 2021-08-22 ENCOUNTER — Ambulatory Visit: Payer: Commercial Managed Care - PPO

## 2021-08-23 ENCOUNTER — Other Ambulatory Visit: Payer: Self-pay | Admitting: Radiation Oncology

## 2021-08-23 ENCOUNTER — Other Ambulatory Visit (HOSPITAL_COMMUNITY): Payer: Self-pay | Admitting: Radiation Oncology

## 2021-08-23 DIAGNOSIS — C539 Malignant neoplasm of cervix uteri, unspecified: Secondary | ICD-10-CM

## 2021-08-25 ENCOUNTER — Other Ambulatory Visit: Payer: Self-pay | Admitting: Gynecologic Oncology

## 2021-08-25 ENCOUNTER — Telehealth: Payer: Self-pay | Admitting: Oncology

## 2021-08-25 DIAGNOSIS — C539 Malignant neoplasm of cervix uteri, unspecified: Secondary | ICD-10-CM

## 2021-08-25 NOTE — Telephone Encounter (Signed)
Maria Davidson called back and confirmed 10/11/21 for her surgery.  She has not made a decision about her ovary yet but is leaning towards having it removed.  She does have some questions for Melissa or Dr. Berline Lopes regarding the amount of tumor left after radiation.  She is wondering if they would be able to call her.

## 2021-08-25 NOTE — Telephone Encounter (Signed)
Left a message with surgery date of 10/11/21.  Requested a return call to confirm.

## 2021-08-26 ENCOUNTER — Telehealth: Payer: Self-pay | Admitting: Gynecologic Oncology

## 2021-08-26 NOTE — Telephone Encounter (Signed)
Called Maria Davidson and advised her that per Maria John, NP and Dr. Charisse Davidson progress note from 08/19/2021, the cervix has a 2-3 (prob cm) central area within the os that appears consistent with likely some residual tumor, overall significantly decreased in size since her exam prior to treatment. Also reviewed that the radiation continues to work over the next several weeks and with the addition of brachytherapy, there should be continued decrease.   She is asking if she really needs to have the brachytherapy treatment since the residual tumor is so small and if she can go directly to surgery.  She is also asking if the 2-3 cm area in the central OS is residual tumor or just dead tissue left over from the original tumor.  She said the progress note from 08/19/21 in MyChart is not clear.  She would really like Maria Davidson or Maria Davidson to call her to clarify.

## 2021-08-26 NOTE — Telephone Encounter (Signed)
I called the patient after she called yesterday and again this morning and talked with my nurse navigator.  She had a number of questions about what was said at her visit and what was in my note.  Despite Santiago Glad, my nurse navigator, reviewing the note with her, she called again this morning with continued questions.  The patient had multiple questions about what was written in my note.  I reviewed the same information that I had with her in clinic, which was that I saw findings consistent with residual tumor in the central part of her cervix.  I tried to be as clear as I could with my words, but she voiced multiple times either hearing different things from me or reading something different in my note (For instance, she referred to the vaginal portion of her speculum exam as saying no masses, which she to mean that there were no masses on her cervix.  I tried to point out that this line is followed directly by the description of the residual tumor seen at the center of her cervix).  Alayah brought up multiple times her decision not to pursue concurrent chemotherapy with radiation.  This was recommended by myself, Dr. Alvy Bimler, as well as Dr. [name].  She ultimately did not feel that this was right for her body in her treatment plan.  Despite what I thought was an in depth conversation while she was last here in clinic about the reason to proceed at this time with one HDR treatment followed by surgery, she voices today not wanting to delay surgery and wanting just to move forward with hysterectomy without HDR treatment.  During our discussion, it was clear to both of [place] that we were frustrated. Marua and I have struggled previously with communication and did so again today.  I have voiced this at one point in our discussion and she agreed.    She voiced likely wanting to seek the remainder of her care with a different oncologist, and I encouraged her to do this if it would be best for her.  I also offered that I  would do my best to meet her at a place where our understanding could be more in line if she wants to continue to care here. She said that she would call back with her decision.  Jeral Pinch MD Gynecologic Oncology

## 2021-08-27 ENCOUNTER — Telehealth: Payer: Self-pay | Admitting: Gynecologic Oncology

## 2021-08-27 NOTE — Telephone Encounter (Signed)
Called the patient to discuss further after our conversation yesterday.  We spent about 40 minutes today discussing her treatment thus far, options in terms of moving forward, including possibility of foregoing 1 HDR treatment, removal of the ovary versus keeping the ovary at the time of surgery, etc.  After all the patient's questions were answered, she voiced wanting to proceed with surgery 6 weeks after finishing her pelvic radiation.  Her preference is to not do the HDR treatment.  I let her know that I will have Melissa reach out to her Monday or Tuesday of next week to finalize this plan.  We would ultimately move up her surgery to the end of February, about 6 weeks from her completion of pelvic radiation on 1/13.  She is leaning towards removal of the ovary at time of surgery.  Jeral Pinch MD Gynecologic Oncology

## 2021-08-29 ENCOUNTER — Telehealth: Payer: Self-pay | Admitting: Gynecologic Oncology

## 2021-08-29 NOTE — Telephone Encounter (Signed)
Called patient to discuss upcoming surgery date. Per Dr. Berline Lopes, her new surgery date would need to be 6 weeks after receiving her last radiation treatment. Based on this information, surgery could be performed on Feb 28. Patient is agreeable with the plan.   Confirmed with her that she still would like to cancel the brachytherapy procedure for Wed of this week. The patient would like this cancelled. We reviewed her upcoming appts for pre-op. Advised to call for any needs or concerns.

## 2021-08-31 ENCOUNTER — Ambulatory Visit (HOSPITAL_BASED_OUTPATIENT_CLINIC_OR_DEPARTMENT_OTHER)
Admission: RE | Admit: 2021-08-31 | Payer: Commercial Managed Care - PPO | Source: Home / Self Care | Admitting: Radiation Oncology

## 2021-08-31 ENCOUNTER — Ambulatory Visit (HOSPITAL_COMMUNITY): Payer: Commercial Managed Care - PPO

## 2021-08-31 ENCOUNTER — Encounter (HOSPITAL_BASED_OUTPATIENT_CLINIC_OR_DEPARTMENT_OTHER): Admission: RE | Payer: Self-pay | Source: Home / Self Care

## 2021-08-31 ENCOUNTER — Ambulatory Visit: Payer: Commercial Managed Care - PPO | Admitting: Radiation Oncology

## 2021-08-31 DIAGNOSIS — C538 Malignant neoplasm of overlapping sites of cervix uteri: Secondary | ICD-10-CM

## 2021-08-31 SURGERY — INSERTION, UTERINE TANDEM AND RING OR CYLINDER, FOR BRACHYTHERAPY
Anesthesia: General

## 2021-09-06 ENCOUNTER — Telehealth: Payer: Self-pay | Admitting: Oncology

## 2021-09-06 NOTE — Telephone Encounter (Signed)
Left a message advising that Dr. Berline Lopes does not mention seeing anything in her note.  Also advised it could be from irritation or a clogged hair follicle.  Recommendations would be to apply a warm compress to the area or soak in a warm bath/sitz bath.

## 2021-09-06 NOTE — Telephone Encounter (Signed)
Maria Davidson called and said she noticed a swollen area like a cyst about the size of a dime on her left labia on the inside near her rectum.  She said it is not sore and is wondering if it is irritation from radiation or wearing a pad everyday.  She also is wondering if Dr. Berline Lopes had noticed it on her last exam.    She also said she is still having some spotting after she wipes but it is not any worse.  Advised her that we will call her back with any recommendations for the cyst.

## 2021-09-12 ENCOUNTER — Encounter: Payer: Self-pay | Admitting: Radiation Oncology

## 2021-09-14 ENCOUNTER — Telehealth: Payer: Self-pay | Admitting: *Deleted

## 2021-09-14 NOTE — Telephone Encounter (Signed)
CALLED PATIENT TO ALTER FU ON 09-19-21 DUE TO DR. KINARD BEING IN THE OR, SPOKE WITH PATIENT AND SHE STATED THAT SHE IS HAVING SURGERY AND SHE WANTS TO CANCEL 09-19-21 FU DUE TO THAT, SHE WILL CALL BACK AFTER SURGERY AND WILL RESCHEDULE THIS FU APPT. WITH DR. [name]

## 2021-09-19 ENCOUNTER — Ambulatory Visit: Payer: Commercial Managed Care - PPO | Admitting: Radiation Oncology

## 2021-09-22 ENCOUNTER — Telehealth: Payer: Self-pay | Admitting: Oncology

## 2021-09-22 NOTE — Telephone Encounter (Signed)
Maria Davidson called and wanted to review her appointments for preop on 09/29/21.  Reviewed appointment times and what to expect.  She verbalized understanding and agreement.

## 2021-09-26 ENCOUNTER — Encounter: Payer: Self-pay | Admitting: Gynecologic Oncology

## 2021-09-29 ENCOUNTER — Encounter (HOSPITAL_COMMUNITY)
Admission: RE | Admit: 2021-09-29 | Discharge: 2021-09-29 | Disposition: A | Discharge status: Home / Self Care | Payer: Commercial Managed Care - PPO | Source: Ambulatory Visit | Attending: Gynecologic Oncology | Admitting: Gynecologic Oncology

## 2021-09-29 ENCOUNTER — Inpatient Hospital Stay: Payer: Commercial Managed Care - PPO | Attending: Gynecologic Oncology | Admitting: Gynecologic Oncology

## 2021-09-29 ENCOUNTER — Other Ambulatory Visit: Payer: Self-pay

## 2021-09-29 ENCOUNTER — Encounter (HOSPITAL_COMMUNITY): Payer: Self-pay

## 2021-09-29 VITALS — BP 126/76 | HR 71 | Temp 98.0°F | Ht 67.0 in | Wt 293.1 lb

## 2021-09-29 DIAGNOSIS — Z01812 Encounter for preprocedural laboratory examination: Secondary | ICD-10-CM | POA: Diagnosis not present

## 2021-09-29 DIAGNOSIS — C539 Malignant neoplasm of cervix uteri, unspecified: Secondary | ICD-10-CM

## 2021-09-29 DIAGNOSIS — C538 Malignant neoplasm of overlapping sites of cervix uteri: Secondary | ICD-10-CM | POA: Diagnosis not present

## 2021-09-29 HISTORY — DX: Malignant neoplasm of cervix uteri, unspecified: C53.9

## 2021-09-29 LAB — COMPREHENSIVE METABOLIC PANEL
ALT: 15 U/L (ref 0–44)
AST: 15 U/L (ref 15–41)
Albumin: 4 g/dL (ref 3.5–5.0)
Alkaline Phosphatase: 75 U/L (ref 38–126)
Anion gap: 8 (ref 5–15)
BUN: 12 mg/dL (ref 6–20)
CO2: 26 mmol/L (ref 22–32)
Calcium: 9 mg/dL (ref 8.9–10.3)
Chloride: 105 mmol/L (ref 98–111)
Creatinine, Ser: 0.64 mg/dL (ref 0.44–1.00)
GFR, Estimated: >60 mL/min (ref >60)
Glucose, Bld: 95 mg/dL (ref 70–99)
Potassium: 4 mmol/L (ref 3.5–5.1)
Sodium: 139 mmol/L (ref 135–145)
Total Bilirubin: 0.7 mg/dL (ref 0.3–1.2)
Total Protein: 7.5 g/dL (ref 6.5–8.1)

## 2021-09-29 LAB — CBC
HCT: 36.4 % (ref 36.0–46.0)
Hemoglobin: 11.8 g/dL — ABNORMAL LOW (ref 12.0–15.0)
MCH: 29.1 pg (ref 26.0–34.0)
MCHC: 32.4 g/dL (ref 30.0–36.0)
MCV: 89.7 fL (ref 80.0–100.0)
Platelets: 192 10*3/uL (ref 150–400)
RBC: 4.06 MIL/uL (ref 3.87–5.11)
RDW: 16.2 % — ABNORMAL HIGH (ref 11.5–15.5)
WBC: 3.8 10*3/uL — ABNORMAL LOW (ref 4.0–10.5)
nRBC: 0 % (ref 0.0–0.2)

## 2021-09-29 MED ORDER — OXYCODONE HCL 5 MG PO TABS: 5.0000 mg | ORAL_TABLET | ORAL | 0 refills | Status: DC | PRN

## 2021-09-29 MED ORDER — SENNOSIDES-DOCUSATE SODIUM 8.6-50 MG PO TABS: 2.0000 | ORAL_TABLET | Freq: Every day | ORAL | 0 refills | Status: DC

## 2021-09-29 MED ORDER — IBUPROFEN 800 MG PO TABS: 800.0000 mg | ORAL_TABLET | Freq: Three times a day (TID) | ORAL | 0 refills | Status: DC | PRN

## 2021-09-29 NOTE — Patient Instructions (Addendum)
Preparing for your Surgery  Plan for surgery on October 04, 2021 with Dr. Jeral Pinch at [facility] will be scheduled for robotic assisted total laparoscopic hysterectomy (removal of the uterus and cervix), unilateral salpingectomy (removal of one fallopian tube), unilateral oophorectomy (removal of one ovary), possible laparotomy (larger incision on your abdomen if needed).   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  Pre-operative Testing - (DONE) You will receive a phone call from presurgical testing at [facility] to arrange for a pre-operative appointment and lab work.  -Bring your insurance card, copy of an advanced directive if applicable, medication list  -At that visit, you will be asked to sign a consent for a possible blood transfusion in case a transfusion becomes necessary during surgery.  The need for a blood transfusion is rare but having consent is a necessary part of your care.     -You should not be taking blood thinners or aspirin at least ten days prior to surgery unless instructed by your surgeon.  -Do not take supplements such as fish oil (omega 3), red yeast rice, turmeric before your surgery. You want to avoid medications with aspirin in them including headache powders such as BC or Goody's), Excedrin migraine.  Day Before Surgery at [place] will be asked to take in a light diet the day before surgery. You will be advised you can have clear liquids up until 3 hours before your surgery.    Eat a light diet the day before surgery.  Examples including soups, broths, toast, yogurt, mashed potatoes.  AVOID GAS PRODUCING FOODS. Things to avoid include carbonated beverages (fizzy beverages, sodas), raw fruits and raw vegetables (uncooked), or beans.   If your bowels are filled with gas, your surgeon will have difficulty visualizing your pelvic organs which increases your surgical risks.  Your role in  recovery Your role is to become active as soon as directed by your doctor, while still giving yourself time to heal.  Rest when you feel tired. You will be asked to do the following in order to speed your recovery:  - Cough and breathe deeply. This helps to clear and expand your lungs and can prevent pneumonia after surgery.  - [place]. Do mild physical activity. Walking or moving your legs help your circulation and body functions return to normal. Do not try to get up or walk alone the first time after surgery.   -If you develop swelling on one leg or the other, pain in the back of your leg, redness/warmth in one of your legs, please call the office or go to the Emergency Room to have a doppler to rule out a blood clot. For shortness of breath, chest pain-seek care in the Emergency Room as soon as possible. - Actively manage your pain. Managing your pain lets you move in comfort. We will ask you to rate your pain on a scale of zero to 10. It is your responsibility to tell your doctor or nurse where and how much you hurt so your pain can be treated.  Special Considerations -If you are diabetic, you may be placed on insulin after surgery to have closer control over your blood sugars to promote healing and recovery.  This does not mean that you will be discharged on insulin.  If applicable, your oral antidiabetics will be resumed when you are tolerating a solid diet.  -Your final pathology results from surgery  should be available around one week after surgery and the results will be relayed to you when available.  -Dr. Lahoma Crocker is the surgeon that assists your GYN Oncologist with surgery.  If you end up staying the night, the next day after your surgery you will either see Dr. Berline Lopes or Dr. Lahoma Crocker.  -FMLA forms can be faxed to [phone] and please allow 5-7 business days for completion.  Pain Management After Surgery -You have been prescribed your pain  medication and bowel regimen medications before surgery so that you can have these available when you are discharged from the hospital. The pain medication is for use ONLY AFTER surgery and a new prescription will not be given.   -Make sure that you have Tylenol and Ibuprofen at home to use on a regular basis after surgery for pain control. We recommend alternating the medications every hour to six hours since they work differently and are processed in the body differently for pain relief.  -Review the attached handout on narcotic use and their risks and side effects.   Bowel Regimen -You have been prescribed Sennakot-S to take nightly to prevent constipation especially if you are taking the narcotic pain medication intermittently.  It is important to prevent constipation and drink adequate amounts of liquids. You can stop taking this medication when you are not taking pain medication and you are back on your normal bowel routine.  Risks of Surgery Risks of surgery are low but include bleeding, infection, damage to surrounding structures, re-operation, blood clots, and very rarely death.   Blood Transfusion Information (For the consent to be signed before surgery)  We will be checking your blood type before surgery so in case of emergencies, we will know what type of blood you would need.                                            WHAT IS A BLOOD TRANSFUSION?  A transfusion is the replacement of blood or some of its parts. Blood is made up of multiple cells which provide different functions. Red blood cells carry oxygen and are used for blood loss replacement. White blood cells fight against infection. Platelets control bleeding. Plasma helps clot blood. Other blood products are available for specialized needs, such as hemophilia or other clotting disorders. BEFORE THE TRANSFUSION  Who gives blood for transfusions?  You may be able to donate blood to be used at a later date on yourself  (autologous donation). Relatives can be asked to donate blood. This is generally not any safer than if you have received blood from a stranger. The same precautions are taken to ensure safety when a relative's blood is donated. Healthy volunteers who are fully evaluated to make sure their blood is safe. This is blood bank blood. Transfusion therapy is the safest it has ever been in the practice of medicine. Before blood is taken from a donor, a complete history is taken to make sure that person has no history of diseases nor engages in risky social behavior (examples are intravenous drug use or sexual activity with multiple partners). The donor's travel history is screened to minimize risk of transmitting infections, such as malaria. The donated blood is tested for signs of infectious diseases, such as HIV and hepatitis. The blood is then tested to be sure it is compatible with you in order to minimize  the chance of a transfusion reaction. If you or a relative donates blood, this is often done in anticipation of surgery and is not appropriate for emergency situations. It takes many days to process the donated blood. RISKS AND COMPLICATIONS Although transfusion therapy is very safe and saves many lives, the main dangers of transfusion include:  Getting an infectious disease. Developing a transfusion reaction. This is an allergic reaction to something in the blood you were given. Every precaution is taken to prevent this. The decision to have a blood transfusion has been considered carefully by your caregiver before blood is given. Blood is not given unless the benefits outweigh the risks.  AFTER SURGERY INSTRUCTIONS  Return to work: 4-6 weeks if applicable  Activity: 1. Be up and out of the bed during the day.  Take a nap if needed.  You may walk up steps but be careful and use the hand rail.  Stair climbing will tire you more than you think, you may need to stop part way and rest.   2. No lifting or  straining for 6 weeks over 10 pounds. No pushing, pulling, straining for 6 weeks.  3. No driving for around 1 week(s).  Do not drive if you are taking narcotic pain medicine and make sure that your reaction time has returned.   4. You can shower as soon as the next day after surgery. Shower daily.  Use your regular soap and water (not directly on the incision) and pat your incision(s) dry afterwards; don't rub.  No tub baths or submerging your body in water until cleared by your surgeon. If you have the soap that was given to you by pre-surgical testing that was used before surgery, you do not need to use it afterwards because this can irritate your incisions.   5. No sexual activity and nothing in the vagina for 8 weeks.  6. You may experience a small amount of clear drainage from your incisions, which is normal.  If the drainage persists, increases, or changes color please call the office.  7. Do not use creams, lotions, or ointments such as neosporin on your incisions after surgery until advised by your surgeon because they can cause removal of the dermabond glue on your incisions.    8. You may experience vaginal spotting after surgery or around the 6-8 week mark from surgery when the stitches at the top of the vagina begin to dissolve.  The spotting is normal but if you experience heavy bleeding, call our office.  9. Take Tylenol or ibuprofen first for pain and only use narcotic pain medication for severe pain not relieved by the Tylenol or Ibuprofen.  Monitor your Tylenol intake to a max of 4,000 mg in a 24 hour period. You can alternate these medications after surgery.  Diet: 1. Low sodium Heart Healthy Diet is recommended but you are cleared to resume your normal (before surgery) diet after your procedure.  2. It is safe to use a laxative, such as Miralax or Colace, if you have difficulty moving your bowels. You have been prescribed Sennakot-S to take at bedtime every evening after surgery  to keep bowel movements regular and to prevent constipation.    Wound Care: 1. Keep clean and dry.  Shower daily.  Reasons to call the Doctor: Fever - Oral temperature greater than 100.4 degrees Fahrenheit Foul-smelling vaginal discharge Difficulty urinating Nausea and vomiting Increased pain at the site of the incision that is unrelieved with pain medicine. Difficulty breathing  with or without chest pain New calf pain especially if only on one side Sudden, continuing increased vaginal bleeding with or without clots.   Contacts: For questions or concerns you should contact:  Dr. Jeral Pinch at [phone]  Joylene John, NP at [phone]  After Hours: call [phone] and have the GYN Oncologist paged/contacted (after 5 pm or on the weekends).  Messages sent via mychart are for non-urgent matters and are not responded to after hours so for urgent needs, please call the after hours number.

## 2021-09-29 NOTE — Progress Notes (Signed)
Patient here for a pre-operative appointment prior to her scheduled surgery on October 04, 2021. She is scheduled for robotic assisted total laparoscopic hysterectomy, unilateral salpingectomy, unilateral oophorectomy, possible laparotomy. 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 She had her pre-admission testing appointment this am at [facility]. Labs reviewed with patient. The surgery was discussed in detail.  See after visit summary for additional details. Visual aids used to discuss items related to surgery including sequential compression stockings, foley catheter, IV pump, multi-modal pain regimen including tylenol, photo of the surgical robot, female reproductive system to discuss surgery in detail.      Discussed post-op pain management in detail including the aspects of the enhanced recovery pathway.  Advised her that a new prescription would be sent in for oxycodone and it is only to be used for after her upcoming surgery.  We discussed the use of tylenol post-op and to monitor for a maximum of 4,000 mg in a 24 hour period.  Also prescribed sennakot to be used after surgery and to hold if having loose stools.  Discussed bowel regimen in detail.     Discussed the use of heparin pre-op, SCDs, and measures to take at home to prevent DVT including frequent mobility.  Reportable signs and symptoms of DVT discussed. Post-operative instructions discussed and expectations for after surgery. Incisional care discussed as well including reportable signs and symptoms including erythema, drainage, wound separation.     10 minutes spent with the patient. Verbalizing understanding of material discussed. No needs or concerns voiced at the end of the visit.  Advised patient to call for any needs.  Advised that her post-operative medications had been prescribed and could be picked up at any time. Hysterectomy form obtained.  This  appointment is included in the global surgical bundle as pre-operative teaching and has no charge.

## 2021-09-29 NOTE — Progress Notes (Signed)
COVID swab appointment: N/A  COVID Vaccine Completed:  No Date COVID Vaccine completed: Has received booster: COVID vaccine manufacturer: [place]   Date of COVID positive in last 90 days: No  PCP - No PCP Cardiologist - N/A  Chest x-ray - N/A EKG - N/A Stress Test - N/A ECHO - N/A Cardiac Cath - N/A Pacemaker/ICD device last checked: Spinal Cord Stimulator:  Bowel Prep - Light diet day before, avoid gas producing foods.  Patient advised  Sleep Study - N/A CPAP -   Fasting Blood Sugar - N/A Checks Blood Sugar _____ times a day  Blood Thinner Instructions:N/A Aspirin Instructions: Last Dose:  Activity level:   Can go up a flight of stairs and perform activities of daily living without stopping and without symptoms of chest pain or shortness of breath.    Anesthesia review: N/A  Patient denies shortness of breath, fever, cough and chest pain at PAT appointment   Patient verbalized understanding of instructions that were given to them at the PAT appointment. Patient was also instructed that they will need to review over the PAT instructions again at home before surgery.

## 2021-09-29 NOTE — Patient Instructions (Addendum)
DUE TO COVID-19 ONLY ONE VISITOR IS ALLOWED TO COME WITH YOU AND STAY IN THE WAITING ROOM ONLY DURING PRE OP AND PROCEDURE.   **NO VISITORS ARE ALLOWED IN THE SHORT STAY AREA OR RECOVERY ROOM!!**   You are not required to quarantine, however you are required to wear a well-fitted mask when you are out and around people not in your household.  Hand Hygiene often Do NOT share personal items Notify your provider if you are in close contact with someone who has COVID or you develop fever 100.4 or greater, new onset of sneezing, cough, sore throat, shortness of breath or body aches.   Your procedure is scheduled on: Tuesday, 10-04-21   Report to [facility] Main Entrance    Report to admitting at 8:15 AM   Call this number if you have problems the morning of surgery [phone]    Follow a light diet day before surgery (avoid gas producing foods)   Do not eat food :After Midnight.   After Midnight you may have the following liquids until 7:30 AM DAY OF SURGERY  Water Black Coffee (sugar ok, NO MILK/CREAM OR CREAMERS)  Tea (sugar ok, NO MILK/CREAM OR CREAMERS) regular and decaf                             Plain Jell-O (NO RED)                                           Fruit ices (not with fruit pulp, NO RED)                                     Popsicles (NO RED)                                                                  Juice: apple, WHITE grape, WHITE cranberry Sports drinks like Gatorade (NO RED) Clear broth(vegetable,chicken,beef)               Drink 1 Ensure drink AT 7:30 AM the morning of surgery.        The day of surgery:  Drink ONE (1) Pre-Surgery Clear Ensure 7:30 AM the morning of surgery. Drink in one sitting. Do not sip.  This drink was given to you during your hospital  pre-op appointment visit. Nothing else to drink after completing the Pre-Surgery Clear Ensure          If you have questions, please contact your surgeons office.     Oral Hygiene  is also important to reduce your risk of infection.                                    Remember - BRUSH YOUR TEETH THE MORNING OF SURGERY WITH YOUR REGULAR TOOTHPASTE   Do NOT smoke after Midnight   Take these medicines the morning of surgery with A SIP OF WATER: None   Stop all vitamins and herbal  supplements a week before surgery.     Stop Motrin, Aleve, Ibuprofen a week before surgery.                            You may not have any metal on your body including hair pins, jewelry, and body piercing             Do not wear make-up, lotions, powders, perfumes or deodorant  Do not wear nail polish including gel and S&S, artificial/acrylic nails, or any other type of covering on natural nails including finger and toenails. If you have artificial nails, gel coating, etc. that needs to be removed by a nail salon please have this removed prior to surgery or surgery may need to be canceled/ delayed if the surgeon/ anesthesia feels like they are unable to be safely monitored.   Do not shave  48 hours prior to surgery.    Contacts, dentures or bridgework may not be worn into surgery.   Do not bring valuables to the hospital. [place].    Patients discharged on the day of surgery will not be allowed to drive home.  Someone NEEDS to stay with you for the first 24 hours after anesthesia.  Please read over the following fact sheets you were given: IF YOU HAVE QUESTIONS ABOUT YOUR PRE-OP INSTRUCTIONS PLEASE CALL [facility] - Preparing for Surgery Before surgery, you can play an important role.  Because skin is not sterile, your skin needs to be as free of germs as possible.  You can reduce the number of germs on your skin by washing with CHG (chlorahexidine gluconate) soap before surgery.  CHG is an antiseptic cleaner which kills germs and bonds with the skin to continue killing germs even after washing. Please DO NOT use if you have an  allergy to CHG or antibacterial soaps.  If your skin becomes reddened/irritated stop using the CHG and inform your nurse when you arrive at Short Stay. Do not shave (including legs and underarms) for at least 48 hours prior to the first CHG shower.  You may shave your face/neck.  Please follow these instructions carefully:  1.  Shower with CHG Soap the night before surgery and the  morning of surgery.  2.  If you choose to wash your hair, wash your hair first as usual with your normal  shampoo.  3.  After you shampoo, rinse your hair and body thoroughly to remove the shampoo.                             4.  Use CHG as you would any other liquid soap.  You can apply chg directly to the skin and wash.  Gently with a scrungie or clean washcloth.  5.  Apply the CHG Soap to your body ONLY FROM THE NECK DOWN.   Do   not use on face/ open                           Wound or open sores. Avoid contact with eyes, ears mouth and   genitals (private parts).                       Wash face,  Genitals (private parts) with your normal soap.  6.  Wash thoroughly, paying special attention to the area where your    surgery  will be performed.  7.  Thoroughly rinse your body with warm water from the neck down.  8.  DO NOT shower/wash with your normal soap after using and rinsing off the CHG Soap.                9.  Pat yourself dry with a clean towel.            10.  Wear clean pajamas.            11.  Place clean sheets on your bed the night of your first shower and do not  sleep with pets. Day of Surgery : Do not apply any lotions/deodorants the morning of surgery.  Please wear clean clothes to the hospital/surgery center.  FAILURE TO FOLLOW THESE INSTRUCTIONS MAY RESULT IN THE CANCELLATION OF YOUR SURGERY  PATIENT SIGNATURE_________________________________  NURSE SIGNATURE__________________________________  ________________________________________________________________________  WHAT IS A BLOOD  TRANSFUSION? Blood Transfusion Information  A transfusion is the replacement of blood or some of its parts. Blood is made up of multiple cells which provide different functions. Red blood cells carry oxygen and are used for blood loss replacement. White blood cells fight against infection. Platelets control bleeding. Plasma helps clot blood. Other blood products are available for specialized needs, such as hemophilia or other clotting disorders. BEFORE THE TRANSFUSION  Who gives blood for transfusions?  Healthy volunteers who are fully evaluated to make sure their blood is safe. This is blood bank blood. Transfusion therapy is the safest it has ever been in the practice of medicine. Before blood is taken from a donor, a complete history is taken to make sure that person has no history of diseases nor engages in risky social behavior (examples are intravenous drug use or sexual activity with multiple partners). The donor's travel history is screened to minimize risk of transmitting infections, such as malaria. The donated blood is tested for signs of infectious diseases, such as HIV and hepatitis. The blood is then tested to be sure it is compatible with you in order to minimize the chance of a transfusion reaction. If you or a relative donates blood, this is often done in anticipation of surgery and is not appropriate for emergency situations. It takes many days to process the donated blood. RISKS AND COMPLICATIONS Although transfusion therapy is very safe and saves many lives, the main dangers of transfusion include:  Getting an infectious disease. Developing a transfusion reaction. This is an allergic reaction to something in the blood you were given. Every precaution is taken to prevent this. The decision to have a blood transfusion has been considered carefully by your caregiver before blood is given. Blood is not given unless the benefits outweigh the risks. AFTER THE TRANSFUSION Right after  receiving a blood transfusion, you will usually feel much better and more energetic. This is especially true if your red blood cells have gotten low (anemic). The transfusion raises the level of the red blood cells which carry oxygen, and this usually causes an energy increase. The nurse administering the transfusion will monitor you carefully for complications. HOME CARE INSTRUCTIONS  No special instructions are needed after a transfusion. You may find your energy is better. Speak with your caregiver about any limitations on activity for underlying diseases you may have. SEEK MEDICAL CARE IF:  Your condition is not improving after your transfusion. You develop redness or irritation at the  intravenous (IV) site. SEEK IMMEDIATE MEDICAL CARE IF:  Any of the following symptoms occur over the next 12 hours: Shaking chills. You have a temperature by mouth above 102 F (38.9 C), not controlled by medicine. Chest, back, or muscle pain. People around you feel you are not acting correctly or are confused. Shortness of breath or difficulty breathing. Dizziness and fainting. You get a rash or develop hives. You have a decrease in urine output. Your urine turns a dark color or changes to pink, red, or brown. Any of the following symptoms occur over the next 10 days: You have a temperature by mouth above 102 F (38.9 C), not controlled by medicine. Shortness of breath. Weakness after normal activity. The white part of the eye turns yellow (jaundice). You have a decrease in the amount of urine or are urinating less often. Your urine turns a dark color or changes to pink, red, or brown. Document Released: 07/21/2000 Document Revised: 10/16/2011 Document Reviewed: 03/09/2008 [facility] Patient Information 2014 [place], [place].  _______________________________________________________________________

## 2021-09-30 ENCOUNTER — Telehealth: Payer: Self-pay | Admitting: Oncology

## 2021-09-30 NOTE — Telephone Encounter (Signed)
Maria Davidson called and said her short term disability paperwork was emailed to the [employer] address.  She is wondering if this can be completed and emailed back today.  Advised her that we did receive the email and will try to complete it as soon as possible but that our policy is for 9-12 days.

## 2021-10-03 ENCOUNTER — Encounter (HOSPITAL_COMMUNITY): Payer: Self-pay | Admitting: Gynecologic Oncology

## 2021-10-03 ENCOUNTER — Telehealth: Payer: Self-pay

## 2021-10-03 NOTE — Telephone Encounter (Signed)
Telephone call to check on pre-operative status. Patient compliant with pre-operative instructions. Reinforced nothing to eat after midnight. Clear liquids until 0730. Patient to arrive at 0815.   Patient inquiring about her disability paperwork. Advised her that our office received her paperwork Friday 09/30/21. Our office is working on completing it and will notify her once it is complete. No questions or concerns voiced.  Instructed to call for any needs.

## 2021-10-04 ENCOUNTER — Other Ambulatory Visit: Payer: Self-pay

## 2021-10-04 ENCOUNTER — Ambulatory Visit (HOSPITAL_BASED_OUTPATIENT_CLINIC_OR_DEPARTMENT_OTHER): Payer: Commercial Managed Care - PPO | Admitting: Anesthesiology

## 2021-10-04 ENCOUNTER — Ambulatory Visit (HOSPITAL_COMMUNITY): Payer: Commercial Managed Care - PPO | Admitting: Anesthesiology

## 2021-10-04 ENCOUNTER — Ambulatory Visit (HOSPITAL_COMMUNITY)
Admission: RE | Admit: 2021-10-04 | Discharge: 2021-10-04 | Disposition: A | Discharge status: Home / Self Care | Payer: Commercial Managed Care - PPO | Attending: Gynecologic Oncology | Admitting: Gynecologic Oncology

## 2021-10-04 ENCOUNTER — Encounter (HOSPITAL_COMMUNITY): Payer: Self-pay | Admitting: Gynecologic Oncology

## 2021-10-04 ENCOUNTER — Encounter (HOSPITAL_COMMUNITY)
Admission: RE | Disposition: A | Discharge status: Home / Self Care | Payer: Self-pay | Source: Home / Self Care | Attending: Gynecologic Oncology

## 2021-10-04 DIAGNOSIS — Z6841 Body Mass Index (BMI) 40.0 and over, adult: Secondary | ICD-10-CM | POA: Insufficient documentation

## 2021-10-04 DIAGNOSIS — C538 Malignant neoplasm of overlapping sites of cervix uteri: Secondary | ICD-10-CM | POA: Diagnosis not present

## 2021-10-04 DIAGNOSIS — Z923 Personal history of irradiation: Secondary | ICD-10-CM | POA: Diagnosis not present

## 2021-10-04 DIAGNOSIS — C539 Malignant neoplasm of cervix uteri, unspecified: Secondary | ICD-10-CM

## 2021-10-04 HISTORY — PX: ROBOTIC ASSISTED BILATERAL SALPINGO OOPHERECTOMY: SHX6078

## 2021-10-04 HISTORY — PX: CYSTOSCOPY: SHX5120

## 2021-10-04 HISTORY — PX: ROBOTIC ASSISTED LAPAROSCOPIC HYSTERECTOMY AND SALPINGECTOMY: SHX6379

## 2021-10-04 LAB — TYPE AND SCREEN
ABO/RH(D): O POS
Antibody Screen: NEGATIVE

## 2021-10-04 LAB — ABO/RH: ABO/RH(D): O POS

## 2021-10-04 LAB — PREGNANCY, URINE: Preg Test, Ur: NEGATIVE

## 2021-10-04 SURGERY — XI ROBOTIC ASSISTED LAPAROSCOPIC HYSTERECTOMY AND SALPINGECTOMY
Anesthesia: General

## 2021-10-04 MED ORDER — HEPARIN SODIUM (PORCINE) 5000 UNIT/ML IJ SOLN
5000.0000 [IU] | INTRAMUSCULAR | Status: AC
  Administered 2021-10-04: 5000 [IU] via SUBCUTANEOUS
  Filled 2021-10-04: qty 1

## 2021-10-04 MED ORDER — PHENYLEPHRINE 40 MCG/ML (10ML) SYRINGE FOR IV PUSH (FOR BLOOD PRESSURE SUPPORT)
PREFILLED_SYRINGE | INTRAVENOUS | Status: AC
  Filled 2021-10-04: qty 10

## 2021-10-04 MED ORDER — CEFAZOLIN IN SODIUM CHLORIDE 3-0.9 GM/100ML-% IV SOLN
3.0000 g | INTRAVENOUS | Status: AC
  Administered 2021-10-04: 3 g via INTRAVENOUS
  Filled 2021-10-04: qty 100

## 2021-10-04 MED ORDER — FENTANYL CITRATE (PF) 100 MCG/2ML IJ SOLN
INTRAMUSCULAR | Status: DC | PRN
  Administered 2021-10-04: 50 ug via INTRAVENOUS
  Administered 2021-10-04: 100 ug via INTRAVENOUS
  Administered 2021-10-04 (×2): 50 ug via INTRAVENOUS

## 2021-10-04 MED ORDER — PHENYLEPHRINE 40 MCG/ML (10ML) SYRINGE FOR IV PUSH (FOR BLOOD PRESSURE SUPPORT)
PREFILLED_SYRINGE | INTRAVENOUS | Status: DC | PRN
  Administered 2021-10-04 (×2): 80 ug via INTRAVENOUS

## 2021-10-04 MED ORDER — ACETAMINOPHEN 10 MG/ML IV SOLN
1000.0000 mg | Freq: Once | INTRAVENOUS | Status: AC
  Administered 2021-10-04: 1000 mg via INTRAVENOUS

## 2021-10-04 MED ORDER — ROCURONIUM BROMIDE 10 MG/ML (PF) SYRINGE
PREFILLED_SYRINGE | INTRAVENOUS | Status: DC | PRN
  Administered 2021-10-04: 100 mg via INTRAVENOUS
  Administered 2021-10-04: 20 mg via INTRAVENOUS
  Administered 2021-10-04 (×2): 10 mg via INTRAVENOUS

## 2021-10-04 MED ORDER — CHLORHEXIDINE GLUCONATE 0.12 % MT SOLN
15.0000 mL | Freq: Once | OROMUCOSAL | Status: AC
  Administered 2021-10-04: 15 mL via OROMUCOSAL

## 2021-10-04 MED ORDER — LIDOCAINE 2% (20 MG/ML) 5 ML SYRINGE
INTRAMUSCULAR | Status: DC | PRN
  Administered 2021-10-04: 1 mg/kg/h via INTRAVENOUS
  Administered 2021-10-04: 100 mg via INTRAVENOUS

## 2021-10-04 MED ORDER — ACETAMINOPHEN 10 MG/ML IV SOLN
INTRAVENOUS | Status: AC
  Filled 2021-10-04: qty 100

## 2021-10-04 MED ORDER — MIDAZOLAM HCL 2 MG/2ML IJ SOLN
INTRAMUSCULAR | Status: AC
  Filled 2021-10-04: qty 2

## 2021-10-04 MED ORDER — KETAMINE HCL 50 MG/5ML IJ SOSY
PREFILLED_SYRINGE | INTRAMUSCULAR | Status: AC
  Filled 2021-10-04: qty 5

## 2021-10-04 MED ORDER — ACETAMINOPHEN 500 MG PO TABS
1000.0000 mg | ORAL_TABLET | ORAL | Status: DC
  Filled 2021-10-04: qty 2

## 2021-10-04 MED ORDER — HYDROMORPHONE HCL 1 MG/ML IJ SOLN: 0.2500 mg | INTRAMUSCULAR | Status: DC | PRN

## 2021-10-04 MED ORDER — STERILE WATER FOR IRRIGATION IR SOLN
Status: DC | PRN
  Administered 2021-10-04: 2000 mL

## 2021-10-04 MED ORDER — BUPIVACAINE HCL 0.25 % IJ SOLN
INTRAMUSCULAR | Status: DC | PRN
  Administered 2021-10-04: 40 mL

## 2021-10-04 MED ORDER — DEXAMETHASONE SODIUM PHOSPHATE 10 MG/ML IJ SOLN
INTRAMUSCULAR | Status: DC | PRN
  Administered 2021-10-04: 10 mg via INTRAVENOUS

## 2021-10-04 MED ORDER — SUGAMMADEX SODIUM 500 MG/5ML IV SOLN
INTRAVENOUS | Status: AC
  Filled 2021-10-04: qty 5

## 2021-10-04 MED ORDER — ENSURE PRE-SURGERY PO LIQD: 296.0000 mL | Freq: Once | ORAL | Status: DC

## 2021-10-04 MED ORDER — PROPOFOL 10 MG/ML IV BOLUS
INTRAVENOUS | Status: AC
  Filled 2021-10-04: qty 20

## 2021-10-04 MED ORDER — LACTATED RINGERS IV SOLN: INTRAVENOUS | Status: DC

## 2021-10-04 MED ORDER — KETOROLAC TROMETHAMINE 30 MG/ML IJ SOLN: 30.0000 mg | Freq: Once | INTRAMUSCULAR | Status: DC | PRN

## 2021-10-04 MED ORDER — KETAMINE HCL-SODIUM CHLORIDE 100-0.9 MG/10ML-% IV SOSY
PREFILLED_SYRINGE | INTRAVENOUS | Status: DC | PRN
  Administered 2021-10-04 (×3): 10 mg via INTRAVENOUS
  Administered 2021-10-04: 20 mg via INTRAVENOUS

## 2021-10-04 MED ORDER — PROPOFOL 10 MG/ML IV BOLUS
INTRAVENOUS | Status: DC | PRN
  Administered 2021-10-04: 200 mg via INTRAVENOUS

## 2021-10-04 MED ORDER — MIDAZOLAM HCL 5 MG/5ML IJ SOLN
INTRAMUSCULAR | Status: DC | PRN
  Administered 2021-10-04: 2 mg via INTRAVENOUS

## 2021-10-04 MED ORDER — LACTATED RINGERS IR SOLN
Status: DC | PRN
  Administered 2021-10-04: 1000 mL

## 2021-10-04 MED ORDER — ESTRADIOL 0.05 MG/24HR TD PTWK
0.0500 mg | MEDICATED_PATCH | TRANSDERMAL | 3 refills | Status: DC
Start: 2021-10-04 — End: 2021-12-29

## 2021-10-04 MED ORDER — LIDOCAINE HCL (PF) 2 % IJ SOLN
INTRAMUSCULAR | Status: AC
  Filled 2021-10-04: qty 5

## 2021-10-04 MED ORDER — SCOPOLAMINE 1 MG/3DAYS TD PT72
1.0000 | MEDICATED_PATCH | TRANSDERMAL | Status: DC
  Administered 2021-10-04: 1.5 mg via TRANSDERMAL
  Filled 2021-10-04: qty 1

## 2021-10-04 MED ORDER — DEXAMETHASONE SODIUM PHOSPHATE 4 MG/ML IJ SOLN: 4.0000 mg | INTRAMUSCULAR | Status: DC

## 2021-10-04 MED ORDER — ORAL CARE MOUTH RINSE: 15.0000 mL | Freq: Once | OROMUCOSAL | Status: AC

## 2021-10-04 MED ORDER — LIDOCAINE HCL 2 % IJ SOLN
INTRAMUSCULAR | Status: AC
  Filled 2021-10-04: qty 20

## 2021-10-04 MED ORDER — ONDANSETRON HCL 4 MG/2ML IJ SOLN
INTRAMUSCULAR | Status: DC | PRN
  Administered 2021-10-04: 4 mg via INTRAVENOUS

## 2021-10-04 MED ORDER — FENTANYL CITRATE (PF) 250 MCG/5ML IJ SOLN
INTRAMUSCULAR | Status: AC
  Filled 2021-10-04: qty 5

## 2021-10-04 MED ORDER — BUPIVACAINE HCL 0.25 % IJ SOLN
INTRAMUSCULAR | Status: AC
  Filled 2021-10-04: qty 1

## 2021-10-04 MED ORDER — ONDANSETRON HCL 4 MG/2ML IJ SOLN: 4.0000 mg | Freq: Once | INTRAMUSCULAR | Status: DC | PRN

## 2021-10-04 MED ORDER — SUGAMMADEX SODIUM 500 MG/5ML IV SOLN
INTRAVENOUS | Status: DC | PRN
  Administered 2021-10-04: 300 mg via INTRAVENOUS

## 2021-10-04 MED ORDER — KETOROLAC TROMETHAMINE 15 MG/ML IJ SOLN: 15.0000 mg | INTRAMUSCULAR | Status: DC

## 2021-10-04 MED ORDER — GABAPENTIN 300 MG PO CAPS
300.0000 mg | ORAL_CAPSULE | ORAL | Status: AC
  Administered 2021-10-04: 300 mg via ORAL
  Filled 2021-10-04: qty 1

## 2021-10-04 MED ORDER — ROCURONIUM BROMIDE 10 MG/ML (PF) SYRINGE
PREFILLED_SYRINGE | INTRAVENOUS | Status: AC
  Filled 2021-10-04: qty 10

## 2021-10-04 SURGICAL SUPPLY — 69 items
APPLICATOR SURGIFLO ENDO (HEMOSTASIS) IMPLANT
BACTOSHIELD CHG 4% 4OZ (MISCELLANEOUS) ×1
BAG COUNTER SPONGE SURGICOUNT (BAG) IMPLANT
BAG LAPAROSCOPIC 12 15 PORT 16 (BASKET) IMPLANT
BAG RETRIEVAL 12/15 (BASKET)
BLADE SURG SZ10 CARB STEEL (BLADE) IMPLANT
COVER BACK TABLE 60X90IN (DRAPES) ×2 IMPLANT
COVER TIP SHEARS 8 DVNC (MISCELLANEOUS) ×1 IMPLANT
COVER TIP SHEARS 8MM DA VINCI (MISCELLANEOUS) ×2
DERMABOND ADVANCED (GAUZE/BANDAGES/DRESSINGS) ×1
DERMABOND ADVANCED .7 DNX12 (GAUZE/BANDAGES/DRESSINGS) ×1 IMPLANT
DRAPE ARM DVNC X/XI (DISPOSABLE) ×4 IMPLANT
DRAPE COLUMN DVNC XI (DISPOSABLE) ×1 IMPLANT
DRAPE DA VINCI XI ARM (DISPOSABLE) ×8
DRAPE DA VINCI XI COLUMN (DISPOSABLE) ×2
DRAPE SHEET LG 3/4 BI-LAMINATE (DRAPES) ×2 IMPLANT
DRAPE SURG IRRIG POUCH 19X23 (DRAPES) ×2 IMPLANT
DRSG OPSITE POSTOP 4X6 (GAUZE/BANDAGES/DRESSINGS) IMPLANT
DRSG OPSITE POSTOP 4X8 (GAUZE/BANDAGES/DRESSINGS) IMPLANT
ELECT PENCIL ROCKER SW 15FT (MISCELLANEOUS) IMPLANT
ELECT REM PT RETURN 15FT ADLT (MISCELLANEOUS) ×2 IMPLANT
GAUZE 4X4 16PLY ~~LOC~~+RFID DBL (SPONGE) ×2 IMPLANT
GLOVE SURG ENC MOIS LTX SZ6 (GLOVE) ×8 IMPLANT
GLOVE SURG ENC MOIS LTX SZ6.5 (GLOVE) ×4 IMPLANT
GOWN STRL REUS W/ TWL LRG LVL3 (GOWN DISPOSABLE) ×4 IMPLANT
GOWN STRL REUS W/TWL LRG LVL3 (GOWN DISPOSABLE) ×8
HOLDER FOLEY CATH W/STRAP (MISCELLANEOUS) IMPLANT
IRRIG SUCT STRYKERFLOW 2 WTIP (MISCELLANEOUS) ×2
IRRIGATION SUCT STRKRFLW 2 WTP (MISCELLANEOUS) ×1 IMPLANT
KIT PROCEDURE DA VINCI SI (MISCELLANEOUS)
KIT PROCEDURE DVNC SI (MISCELLANEOUS) IMPLANT
KIT TURNOVER KIT A (KITS) IMPLANT
MANIPULATOR ADVINCU DEL 3.0 PL (MISCELLANEOUS) IMPLANT
MANIPULATOR ADVINCU DEL 3.5 PL (MISCELLANEOUS) IMPLANT
MANIPULATOR UTERINE 4.5 ZUMI (MISCELLANEOUS) IMPLANT
NDL HYPO 21X1.5 SAFETY (NEEDLE) ×1 IMPLANT
NDL SPNL 18GX3.5 QUINCKE PK (NEEDLE) IMPLANT
NEEDLE HYPO 21X1.5 SAFETY (NEEDLE) ×2 IMPLANT
NEEDLE SPNL 18GX3.5 QUINCKE PK (NEEDLE) IMPLANT
OBTURATOR OPTICAL STANDARD 8MM (TROCAR) ×2
OBTURATOR OPTICAL STND 8 DVNC (TROCAR) ×1
OBTURATOR OPTICALSTD 8 DVNC (TROCAR) ×1 IMPLANT
PACK ROBOT GYN CUSTOM WL (TRAY / TRAY PROCEDURE) ×2 IMPLANT
PAD POSITIONING PINK XL (MISCELLANEOUS) ×2 IMPLANT
PORT ACCESS TROCAR AIRSEAL 12 (TROCAR) ×1 IMPLANT
PORT ACCESS TROCAR AIRSEAL 5M (TROCAR) ×1
POUCH SPECIMEN RETRIEVAL 10MM (ENDOMECHANICALS) IMPLANT
SCRUB CHG 4% DYNA-HEX 4OZ (MISCELLANEOUS) ×1 IMPLANT
SEAL CANN UNIV 5-8 DVNC XI (MISCELLANEOUS) ×4 IMPLANT
SEAL XI 5MM-8MM UNIVERSAL (MISCELLANEOUS) ×8
SET TRI-LUMEN FLTR TB AIRSEAL (TUBING) ×2 IMPLANT
SPIKE FLUID TRANSFER (MISCELLANEOUS) ×2 IMPLANT
SPONGE T-LAP 18X18 ~~LOC~~+RFID (SPONGE) IMPLANT
SURGIFLO W/THROMBIN 8M KIT (HEMOSTASIS) IMPLANT
SUT MNCRL AB 4-0 PS2 18 (SUTURE) IMPLANT
SUT PDS AB 1 TP1 96 (SUTURE) IMPLANT
SUT VIC AB 0 CT1 27 (SUTURE) ×2
SUT VIC AB 0 CT1 27XBRD ANTBC (SUTURE) IMPLANT
SUT VIC AB 2-0 CT1 27 (SUTURE)
SUT VIC AB 2-0 CT1 TAPERPNT 27 (SUTURE) IMPLANT
SUT VIC AB 4-0 PS2 18 (SUTURE) ×4 IMPLANT
SYR 10ML LL (SYRINGE) IMPLANT
TOWEL OR NON WOVEN STRL DISP B (DISPOSABLE) IMPLANT
TRAP SPECIMEN MUCUS 40CC (MISCELLANEOUS) IMPLANT
TRAY FOLEY MTR SLVR 16FR STAT (SET/KITS/TRAYS/PACK) ×2 IMPLANT
TROCAR XCEL NON-BLD 5MMX100MML (ENDOMECHANICALS) IMPLANT
UNDERPAD 30X36 HEAVY ABSORB (UNDERPADS AND DIAPERS) ×4 IMPLANT
WATER STERILE IRR 1000ML POUR (IV SOLUTION) ×2 IMPLANT
YANKAUER SUCT BULB TIP 10FT TU (MISCELLANEOUS) IMPLANT

## 2021-10-04 NOTE — H&P (Addendum)
Gynecologic Oncology History and Physical  10/04/21  Treatment History: Oncology History  Cervical cancer ([place])  01/05/2021 Initial Diagnosis   In June, she had a period for 3-6 days with significant back pain and pelvic cramping.  This was more bleeding than she had normally had    06/07/2021 Pathology Results   [id number] Cervical biopsy showed adenocarcinoma    06/09/2021 Procedure   Patient underwent colposcopy on 11/3.  Findings at that time was a fleshy, friable mass occupying most of the cervix measuring 3 x 3 cm with polypoid protrusions.  Cervical os unable to be identified.  Biopsies were taken that revealed at least adenocarcinoma in situ.   06/15/2021 Initial Diagnosis   Cervical cancer ([place])   06/24/2021 PET scan   1. Ill-defined soft tissue and hypermetabolic activity at the expected area of the cervix, compatible with primary malignancy. 2. No evidence of FDG avid metastatic disease in the chest, abdomen or pelvis. 3. Hypermetabolic activity of the left ovary, likely physiologic.   07/01/2021 Cancer Staging   Staging form: Cervix Uteri, AJCC Version 9 - Clinical stage from 07/01/2021: FIGO Stage IB3 (cT1b3, cN0, cM0) - Signed by Maria Lark, MD on 07/01/2021 Stage prefix: Initial diagnosis    07/15/2021 - 07/15/2021 Chemotherapy   Patient is on Treatment Plan : BLADDER Cisplatin q7d + XRT      The patient began having vaginal discharge in 10/2020. She underwent pap test in 01/2021 which showed atypical glandular cells, HR HPV positive. Colposcopy was recommended. Patient ultimately decided to wait to see if menstrual cycels beame more regular.   She then presented to the emergency department at [facility] in [place] on 10/29 complaining of pelvic pain and lower back pain for a week.  She describes this today as having significant low back pain and stabbing lower abdominal pain.  She also noted cloudy urine with strong odor.  Imaging was obtained during that visit  noted below, with no obvious source for abdominal pain.  Patient was ultimately treated for acute cystitis without hematuria.  Urine culture ultimately showed 70,000 colony-forming units of E. coli.   Patient underwent colposcopy on 11/3.  Findings at that time was a fleshy, friable mass occupying most of the cervix measuring 3 x 3 cm with polypoid protrusions.  Cervical os unable to be identified.  Biopsies were taken that revealed at least adenocarcinoma in situ.  At the time of her visit with me, a 5 x 3.5 cm frondular tumor was noted replacing most of the cervix. The patient was initially counseled, given the size of her mass, need for postoperative adjuvant RT, and her BMI, that I recommended proceeding with primary chemotherapy and RT. We had discussed that this would include pelvic radiation with sensitizing cisplatin followed by brachytherapy. Additionally, we discussed the possibility of a modified course of brachytherapy, depending on her exam after completion of pelvic RT, with plan for interval hysterectomy.     The patient very much wanted to have upfront surgery and sought a second opinion at [facility]. The recommendation from this visit were for primary chemoradiation with consideration of interval hysterectomy after completion of brachytherapy. The patient ultimately decided to undergo primary treatment at [facility].   After meeting with Drs. Maria Davidson and Maria Davidson, and after being counseled of improved outcomes with the addition of sensitizing chemotherapy to RT, the patient elected to proceed with RT alone.   On exam after completion of pelvic radiation, the patient had what appeared to be a smaller area  of residual tumor (versus treated tumor) with significant reduction in the size of visible lesion on her cervix. She very strongly wished to proceed with hysterectomy. I counseled her that I recommended proceeding with a modified course of brachytherapy, as has been our standard practice for patients  undergoing adjuvant hysterectomy, so as to decrease the amount of total Gy given to point A and hopefully decrease the morbidity associated with surgery. I reviewed that our data in patients who undergo interval hysterectomy after completion of chemotherapy and radiation has shown the possibility of improved local control without a change in overall survival. Most studies give standard brachytherapy or decrease the dose of brachytherapy some in those undergoing interval hysterectomy. I believe that deferment of any brachytherapy will worsen her overall survival.   After multiple extensive conversations, the patient voiced understanding my concerns with regard to deferment of brachytherapy but had decided that she did not want HDR and wished to proceed with interval hysterectomy. She had decided, after doing her own research, that she did not want sensitizing chemotherapy. Similarly, she had done her own research, and did not want the brachytherapy component of her primary radiation.   We discussed the risk of ovarian metastasis in the setting of early stage ovarian cancer, which is low but increased in adenocarcinomas. After discussing risks and benefits of ovarian preservation versus excision, she has decided to proceed with excision.     Interval History: Patient presents today for interval surgery. Overall she is doing well. Denies vaginal bleeding, + minimal discharge not requiring the use of a pad.  Past Medical/Surgical History: Past Medical History:  Diagnosis Date   Cervical cancer ([place])    History of radiation therapy    Cervix 07/14/21-08/19/21-Dr. Gery Davidson   Obesity, Class III, BMI 40-49.9 (morbid obesity) (HCC)    Ovarian tumor (benign) 12/06/1999    Past Surgical History:  Procedure Laterality Date   LAPAROSCOPIC UNILATERAL SALPINGO OOPHERECTOMY     right side - lap    Family History  Problem Relation Age of Onset   Cancer Mother 37       uterine cancer   Colon cancer  Neg Hx    Breast cancer Neg Hx    Ovarian cancer Neg Hx    Endometrial cancer Neg Hx    Pancreatic cancer Neg Hx    Prostate cancer Neg Hx     Social History   Socioeconomic History   Marital status: Divorced    Spouse name: Not on file   Number of children: 2   Years of education: Not on file   Highest education level: Not on file  Occupational History   Occupation: [employer]  Tobacco Use   Smoking status: Never   Smokeless tobacco: Never  Vaping Use   Vaping Use: Never used  Substance and Sexual Activity   Alcohol use: Not Currently   Drug use: No   Sexual activity: Yes    Birth control/protection: None  Other Topics Concern   Not on file  Social History Narrative   Not on file   Social Determinants of Health   Financial Resource Strain: Not on file  Food Insecurity: Not on file  Transportation Needs: Not on file  Physical Activity: Not on file  Stress: Not on file  Social Connections: Not on file    Current Medications:  Current Facility-Administered Medications:    acetaminophen (OFIRMEV) 10 MG/ML IV, , , ,    acetaminophen (OFIRMEV) IV 1,000 mg, 1,000 mg, Intravenous, Once,  Myrtie Soman, MD   ceFAZolin (ANCEF) IVPB 3g/100 mL premix, 3 g, Intravenous, On Call to OR, Cross, Melissa D, NP   dexamethasone (DECADRON) injection 4 mg, 4 mg, Intravenous, On Call to OR, Cross, Melissa D, NP   ketorolac (TORADOL) 15 MG/ML injection 15 mg, 15 mg, Intravenous, On Call to OR, Elinor Parkinson, Carollee Massed, NP   lactated ringers infusion, , Intravenous, Continuous, Roderic Palau, MD, Last Rate: 10 mL/hr at 10/04/21 0934, New Bag at 10/04/21 0934   scopolamine (TRANSDERM-SCOP) 1 MG/3DAYS 1.5 mg, 1 patch, Transdermal, On Call to OR, Cross, Melissa D, NP, 1.5 mg at 10/04/21 0908  Review of Systems: Denies appetite changes, fevers, chills, fatigue, unexplained weight changes. Denies hearing loss, neck lumps or masses, mouth sores, ringing in ears or voice changes. Denies cough or  wheezing.  Denies shortness of breath. Denies chest pain or palpitations. Denies leg swelling. Denies abdominal distention, pain, blood in stools, constipation, diarrhea, nausea, vomiting, or early satiety. Denies pain with intercourse, dysuria, frequency, hematuria or incontinence. Denies hot flashes, pelvic pain, vaginal bleeding.   Denies joint pain, back pain or muscle pain/cramps. Denies itching, rash, or wounds. Denies dizziness, headaches, numbness or seizures. Denies swollen lymph nodes or glands, denies easy bruising or bleeding. Denies anxiety, depression, confusion, or decreased concentration.  Physical Exam: Ht 5\' 7"  (1.702 m)    Wt 293 lb 3.4 oz (133 kg)    LMP 08/21/2021 Comment: preg.test pending 10/04/21   BMI 45.92 kg/m  General: Alert, oriented, no acute distress.  HEENT: Normocephalic, atraumatic. Sclera anicteric.  Chest: Clear to auscultation bilaterally. No wheezes, rhonchi, or rales. Cardiovascular: Regular rate and rhythm, no murmurs, rubs, or gallops.  Abdomen: Obese. Normoactive bowel sounds. Soft, nondistended, nontender to palpation. No masses or hepatosplenomegaly appreciated. No palpable fluid wave.  Well-healed midline incision. Extremities: Grossly normal range of motion. Warm, well perfused. No edema bilaterally.  Skin: No rashes or lesions.   Laboratory & Radiologic Studies: CBC    Component Value Date/Time   WBC 3.8 (L) 09/29/2021 0936   RBC 4.06 09/29/2021 0936   HGB 11.8 (L) 09/29/2021 0936   HGB 11.7 (L) 08/11/2021 0805   HCT 36.4 09/29/2021 0936   PLT 192 09/29/2021 0936   PLT 171 08/11/2021 0805   MCV 89.7 09/29/2021 0936   MCH 29.1 09/29/2021 0936   MCHC 32.4 09/29/2021 0936   RDW 16.2 (H) 09/29/2021 0936   LYMPHSABS 0.5 (L) 08/11/2021 0805   MONOABS 0.2 08/11/2021 0805   EOSABS 0.0 08/11/2021 0805   BASOSABS 0.0 08/11/2021 0805   BMP Latest Ref Rng & Units 09/29/2021 08/11/2021 07/27/2009  Glucose 70 - 99 mg/dL 95 96 109(H)  BUN 6 - 20  mg/dL 12 9 7   Creatinine 0.44 - 1.00 mg/dL 0.64 0.63 0.3(L)  Sodium 135 - 145 mmol/L 139 139 141  Potassium 3.5 - 5.1 mmol/L 4.0 4.0 3.6  Chloride 98 - 111 mmol/L 105 105 107  CO2 22 - 32 mmol/L 26 27 -  Calcium 8.9 - 10.3 mg/dL 9.0 8.6(L) -   Assessment & Plan: Maria Davidson is a 41 y.o. woman with Stage IB3 adenocarcinoma of the cervix who presents for interval hysterectomy after completion of pelvic RT (declined radiosensitizing Cisplatin, declined modified brachytherapy after completion of pelvic RT). After discussing pros and cons of ovarian preservation versus excision, she'd like to proceed with excision of remaining ovary. Will plan to start low-medium dose ERT 1+ week after surgery.  Jeral Pinch, MD  Division of Gynecologic  Oncology  Department of Obstetrics and Gynecology  [facility]

## 2021-10-04 NOTE — Discharge Instructions (Signed)
°  Medications:  - Take ibuprofen and tylenol first line for pain control. Take these regularly (every 6 hours) to decrease the build up of pain.  - If necessary, for severe pain not relieved by ibuprofen, take tramadol.  - While taking tramadol you should take sennakot every night to reduce the likelihood of constipation. If this causes diarrhea, stop its use.  -Start estrogen patch 1 week after surgery  Diet: 1. Low sodium Heart Healthy Diet is recommended.  2. It is safe to use a laxative if you have difficulty moving your bowels.   Wound Care: 1. Keep clean and dry.  Shower daily.  Reasons to call the Doctor:  Fever - Oral temperature greater than 100.4 degrees Fahrenheit Foul-smelling vaginal discharge Difficulty urinating Nausea and vomiting Increased pain at the site of the incision that is unrelieved with pain medicine. Difficulty breathing with or without chest pain New calf pain especially if only on one side Sudden, continuing increased vaginal bleeding with or without clots.   Follow-up: 1. See Jeral Pinch in 3 weeks. You will have a phone visit once pathology is back.  Contacts: For questions or concerns you should contact:  Dr. Jeral Pinch at [phone] After hours and on week-ends call [phone] and ask to speak to the physician on call for Gynecologic Oncology

## 2021-10-04 NOTE — Anesthesia Preprocedure Evaluation (Signed)
Anesthesia Evaluation  Patient identified by MRN, date of birth, ID band Patient awake    Reviewed: Allergy & Precautions, NPO status , Patient's Chart, lab work & pertinent test results  Airway Mallampati: III  TM Distance: <3 FB Neck ROM: Full    Dental no notable dental hx.    Pulmonary neg pulmonary ROS,    Pulmonary exam normal breath sounds clear to auscultation       Cardiovascular negative cardio ROS Normal cardiovascular exam Rhythm:Regular Rate:Normal     Neuro/Psych negative neurological ROS  negative psych ROS   GI/Hepatic negative GI ROS, Neg liver ROS,   Endo/Other  Morbid obesity  Renal/GU negative Renal ROS  negative genitourinary   Musculoskeletal negative musculoskeletal ROS (+)   Abdominal   Peds negative pediatric ROS (+)  Hematology negative hematology ROS (+)   Anesthesia Other Findings   Reproductive/Obstetrics negative OB ROS                             Anesthesia Physical Anesthesia Plan  ASA: 2  Anesthesia Plan: General   Post-op Pain Management: Dilaudid IV   Induction: Intravenous  PONV Risk Score and Plan: 3 and Ondansetron, Dexamethasone, Midazolam, Droperidol and Treatment may vary due to age or medical condition  Airway Management Planned: Oral ETT  Additional Equipment:   Intra-op Plan:   Post-operative Plan: Extubation in OR  Informed Consent: I have reviewed the patients History and Physical, chart, labs and discussed the procedure including the risks, benefits and alternatives for the proposed anesthesia with the patient or authorized representative who has indicated his/her understanding and acceptance.     Dental advisory given  Plan Discussed with: CRNA and Surgeon  Anesthesia Plan Comments:         Anesthesia Quick Evaluation

## 2021-10-04 NOTE — Op Note (Signed)
OPERATIVE NOTE  Pre-operative Diagnosis: Stage IB3 adenocarcinoma of the cervix, s/p pelvic radiation  Post-operative Diagnosis: same  Operation: Modified radical robotic-assisted laparoscopic total hysterectomy with bilateral salpingectomy, left oophorectomy   Surgeon: Jeral Pinch MD  Assistant Surgeon: Lahoma Crocker MD (an MD assistant was necessary for tissue manipulation, management of robotic instrumentation, retraction and positioning due to the complexity of the case and hospital policies).   Anesthesia: GET  Urine Output: 350cc  Operative Findings: On EUA, cervix 3-4cm, somewhat flush with the left vaginal sidewall, not firm or nodular, no parametrial involvement. On speculum exam, small, <1 cm discoloration around the cervical os that appears c/w tumor vs treated tumor. On intra-abdominal entry, normal upper abdominal survey. Normal omentum, small and large bowel. Somewhat bulbous 8 cm uterus. Right ovary surgically absent. Bilateral tubes normal appearing with some evidence of possibly endosalpingiosis on the left tubes. Normal left ovary. Some mild retroperitoneal fibrosis and edema consistent recent radiation. Some adhesions between the bladder and LUS/Cervix. No obvious extra-cervical evidence of disease.  Given concern for close margin to the cervix on the left and posterior aspect, additional left and posterior vaginal margin taken. On cystoscopy, bladder including dome intact, good efflux noted from bilateral ureteral orifices.   Estimated Blood Loss:  150cc      Total IV Fluids: see I&O flowsheet         Specimens: uterus, cervix, bilateral tubes, left ovary, posterior and left vaginal margin         Complications:  None apparent; patient tolerated the procedure well.         Disposition: PACU - hemodynamically stable.  Procedure Details  The patient was seen in the Holding Room. The risks, benefits, complications, treatment options, and expected outcomes  were discussed with the patient.  The patient concurred with the proposed plan, giving informed consent.  The site of surgery properly noted/marked. The patient was identified as Conservation officer, historic buildings and the procedure verified as a Robotic-assisted hysterectomy with bilateral salpingectomy, unilateral oophorectomy.   After induction of anesthesia, the patient was draped and prepped in the usual sterile manner. Patient was placed in supine position after anesthesia and draped and prepped in the usual sterile manner as follows: Her arms were tucked to her side with all appropriate precautions.  The shoulders were stabilized with padded shoulder blocks applied to the acromium processes.  The patient was placed in the semi-lithotomy position in [place].  The perineum and vagina were prepped with CholoraPrep. The patient was draped after the CholoraPrep had been allowed to dry for 3 minutes.  A Time Out was held and the above information confirmed.  The urethra was prepped with Betadine. Foley catheter was placed.  A sterile speculum was placed in the vagina.  OG tube placement was confirmed and to suction.   Next, a 10 mm skin incision was made 1 cm below the subcostal margin in the midclavicular line.  The 5 mm Optiview port and scope was used for direct entry.  Opening pressure was under 10 mm CO2.  The abdomen was insufflated and the findings were noted as above.   At this point and all points during the procedure, the patient's intra-abdominal pressure did not exceed 15 mmHg. Next, an 8 mm skin incision was made superior the umbilicus and a right and left port were placed about 8 cm lateral to the robot port on the right and left side.  A fourth arm was placed on the right.  The 5  mm assist trocar was exchanged for a 10-12 mm port. All ports were placed under direct visualization.  The patient was placed in steep Trendelenburg.  Bowel was folded away into the upper abdomen.  The robot was docked in the normal  manner.  The right and left peritoneum were opened parallel to the IP ligament to open the retroperitoneal spaces bilaterally. The round ligaments were transected. On the right, the infundibulopelvic ligament with ovarian vessels was intact despite prior surgical resection of the right ovary. The ureter was noted to be on the medial leaf of the broad ligament.  The peritoneum above the ureter was incised and stretched and the infundibulopelvic ligament was skeletonized, cauterized and cut.   The posterior peritoneum was taken down to the level of the cervix. An EEA sizer was used in the posterior vagina to delineate the cervicovaginal junction. The posterior peritoneum was grasped and the rectovaginal space was developed, dropping the peritoneum infection to the sizer. The anterior peritoneum was also taken down.  The bladder flap was created to below the level of the EEA sizer.  The uterine artery on the right side was skeletonized, cauterized and cut in the normal manner.  A similar procedure was performed on the left.  The colpotomy was made on the EEA sizer anteriorly and then the sizer was retracted to help delineate the cervix posteriorly. Once detached, the uterus, cervix, bilateral tubes and left ovary were amputated and delivered through the vagina.  The specimen was examined, and given concern for close margin on the left, additional margin was resected from the left vagina and posterior vagina, creating the rectovaginal space further.   Pedicles were inspected and excellent hemostasis was achieved.    The colpotomy at the vaginal cuff was closed with Vicryl on a CT1 needle in running manner with two sutures starting at each apex. The two sutures were tied together in the midline.  Irrigation was used and excellent hemostasis was achieved. Several figure of eight sutures were then used to close the RV space, attaching it to the posterior vagina.     Cystoscopy was then performed, given the needed  anterior dissection and unanticipated need for modified radical hysterectomy to achieve sufficient margins. The bladder was instilled with sterile fluid, the foley catheter was removed, and cystoscopy was performed with findings noted above.   At this point in the procedure was completed.  Robotic instruments were removed under direct visulaization.  The robot was undocked. The fascia at the 10-12 mm port was closed with 0 Vicryl on a UR-5 needle.  The subcuticular tissue was closed with 4-0 Vicryl and the skin was closed with 4-0 Monocryl in a subcuticular manner. Several incisions were oozing and several mattress sutures were placed to achieve hemostasis. Dermabond was applied.    The vagina was swabbed with minimal bleeding noted.  All sponge, lap and needle counts were correct x  3.   The patient was transferred to the recovery room in stable condition.  Jeral Pinch, MD

## 2021-10-04 NOTE — Brief Op Note (Signed)
10/04/2021  2:14 PM  PATIENT:  Maria Davidson  41 y.o. female  PRE-OPERATIVE DIAGNOSIS:  CERVICAL CANCER  POST-OPERATIVE DIAGNOSIS:  CERVICAL CANCER  PROCEDURE:  Procedure(s): XI ROBOTIC ASSISTED LAPAROSCOPIC MODIFIED RADICAL HYSTERECTOMY AND BILATERAL SALPINGECTOMY (N/A) XI ROBOTIC ASSISTED LEFT OOPHORECTOMY (N/A) CYSTOSCOPY (N/A)  SURGEON:  Surgeon(s) and Role:    Lafonda Mosses, MD - Primary    Lahoma Crocker, MD - Assisting  EBL:  150 mL   BLOOD ADMINISTERED:none  DRAINS: none   LOCAL MEDICATIONS USED:  MARCAINE     SPECIMEN:  uterus, cervix, bilateral tubes, left ovary, posterior and left vaginal margin  DISPOSITION OF SPECIMEN:  PATHOLOGY  COUNTS:  YES  TOURNIQUET:  * No tourniquets in log *  DICTATION: .Note written in EPIC  PLAN OF CARE: Discharge to home after PACU  PATIENT DISPOSITION:  PACU - hemodynamically stable.   Delay start of Pharmacological VTE agent (>24hrs) due to surgical blood loss or risk of bleeding: not applicable

## 2021-10-04 NOTE — Anesthesia Procedure Notes (Signed)
Procedure Name: Intubation Date/Time: 10/04/2021 11:13 AM Performed by: Lavina Hamman, CRNA Pre-anesthesia Checklist: Patient identified, Emergency Drugs available, Suction available, Patient being monitored and Timeout performed Patient Re-evaluated:Patient Re-evaluated prior to induction Oxygen Delivery Method: Circle system utilized Preoxygenation: Pre-oxygenation with 100% oxygen Induction Type: IV induction Ventilation: Mask ventilation without difficulty Laryngoscope Size: Mac and 3 Grade View: Grade I Tube type: Oral Tube size: 7.5 mm Number of attempts: 1 Airway Equipment and Method: Stylet Placement Confirmation: ETT inserted through vocal cords under direct vision, positive ETCO2, CO2 detector and breath sounds checked- equal and bilateral Secured at: 22 cm Tube secured with: Tape Dental Injury: Teeth and Oropharynx as per pre-operative assessment  Comments: ATOI

## 2021-10-05 ENCOUNTER — Encounter (HOSPITAL_COMMUNITY): Payer: Self-pay | Admitting: Gynecologic Oncology

## 2021-10-05 ENCOUNTER — Telehealth: Payer: Self-pay

## 2021-10-05 NOTE — Transfer of Care (Signed)
Immediate Anesthesia Transfer of Care Note  Patient: Maria Davidson  Procedure(s) Performed: Procedure(s): XI ROBOTIC ASSISTED LAPAROSCOPIC MODIFIED RADICAL HYSTERECTOMY AND BILATERAL SALPINGECTOMY (N/A) XI ROBOTIC ASSISTED LEFT OOPHORECTOMY (N/A) CYSTOSCOPY (N/A)  Patient Location: PACU  Anesthesia Type:General  Level of Consciousness:  sedated, patient cooperative and responds to stimulation  Airway & Oxygen Therapy:Patient Spontanous Breathing and Patient connected to face mask oxgen  Post-op Assessment:  Report given to PACU RN and Post -op Vital signs reviewed and stable  Post vital signs:  Reviewed and stable  Last Vitals:  Vitals:   10/04/21 1455 10/04/21 1511  BP: (!) 142/96 139/77  Pulse: 70 72  Resp: 20 (!) 22  Temp:    SpO2: 06% 00%    Complications: No apparent anesthesia complications

## 2021-10-05 NOTE — Telephone Encounter (Signed)
Spoke with [name] this morning. She states she is eating, drinking and urinating well. She had some burning with urination but this has resolved. She has not had a BM yet but is passing gas. Her pharmacy did not have senokot-s but she does have [employer] brand stool softener and laxative that she is taking. Encouraged her to drink plenty of water and be up and moving throughout the day. She denies fever or chills. Incisions are dry and intact. She rates her pain 0/10. Her pain is controlled with tylenol. " I feel wonderful, I am so surprised. I've hardly had any pain since the surgery." ? ?Patient reports some soreness in her throat with a dry cough. Advised patient to gargle with warm salt water.    ? ?Patient inquiring about her short term disability paperwork. Advised that paperwork has been completed, we are awaiting provider signature and then we will send this to her. Most likely by tomorrow.  ? ?Instructed to call office with any fever, chills, purulent drainage, uncontrolled pain or any other questions or concerns. Patient verbalizes understanding of all of the information above.  ? ?Pt aware of post op appointments as well as the office number [phone] and after hours number [phone] to call if she has any questions or concerns  ?

## 2021-10-05 NOTE — Anesthesia Postprocedure Evaluation (Signed)
Anesthesia Post Note ? ?Patient: Maria Davidson ? ?Procedure(s) Performed: XI ROBOTIC ASSISTED LAPAROSCOPIC MODIFIED RADICAL HYSTERECTOMY AND BILATERAL SALPINGECTOMY ?XI ROBOTIC ASSISTED LEFT OOPHORECTOMY ?CYSTOSCOPY ? ?  ? ?Patient location during evaluation: PACU ?Anesthesia Type: General ?Level of consciousness: awake and alert ?Pain management: pain level controlled ?Vital Signs Assessment: post-procedure vital signs reviewed and stable ?Respiratory status: spontaneous breathing, nonlabored ventilation, respiratory function stable and patient connected to nasal cannula oxygen ?Cardiovascular status: blood pressure returned to baseline and stable ?Postop Assessment: no apparent nausea or vomiting ?Anesthetic complications: no ? ? ?No notable events documented. ? ?Last Vitals:  ?Vitals:  ? 10/04/21 1455 10/04/21 1511  ?BP: (!) 142/96 139/77  ?Pulse: 70 72  ?Resp: 20 (!) 22  ?Temp:    ?SpO2: 98% 96%  ?  ?Last Pain:  ?Vitals:  ? 10/04/21 1511  ?TempSrc:   ?PainSc: 0-No pain  ? ? ?  ?  ?  ?  ?  ?  ? ?Shrita Thien S ? ? ? ? ?

## 2021-10-06 ENCOUNTER — Telehealth: Payer: Self-pay | Admitting: *Deleted

## 2021-10-06 NOTE — Telephone Encounter (Signed)
Maria Davidson disability papers completed; forms fax and emailed to both [place] and the patient. Called and left the patient a message with this information   ?

## 2021-10-07 ENCOUNTER — Telehealth: Payer: Self-pay | Admitting: *Deleted

## 2021-10-07 ENCOUNTER — Other Ambulatory Visit: Payer: Self-pay

## 2021-10-07 ENCOUNTER — Encounter: Payer: Self-pay | Admitting: Gynecologic Oncology

## 2021-10-07 LAB — SURGICAL PATHOLOGY

## 2021-10-07 NOTE — Telephone Encounter (Signed)
Patient called and stated "Is it normal for the incision under the left breast to be raised? The incision is not bleeding, red, warm to the touch or having an discharge. Also is it nor,mal to have a little discharge after surgery?" Patient to send a picture of incision to my chart. Explained that the office will call her back  ?

## 2021-10-07 NOTE — Telephone Encounter (Signed)
Spoke with Maria Davidson and told her that Dr. Berline Lopes is currently in meetings and will call her after she is finished with the meetings. ?Pt very appreciative of the return call being the end of the work day. She will have her phone near her so she does not miss Dr. Charisse March call. ?

## 2021-10-11 DIAGNOSIS — C538 Malignant neoplasm of overlapping sites of cervix uteri: Secondary | ICD-10-CM

## 2021-10-12 ENCOUNTER — Other Ambulatory Visit: Payer: Self-pay

## 2021-10-12 ENCOUNTER — Inpatient Hospital Stay: Payer: Commercial Managed Care - PPO | Attending: Gynecologic Oncology | Admitting: Gynecologic Oncology

## 2021-10-12 ENCOUNTER — Encounter: Payer: Self-pay | Admitting: Gynecologic Oncology

## 2021-10-12 DIAGNOSIS — Z90710 Acquired absence of both cervix and uterus: Secondary | ICD-10-CM

## 2021-10-12 DIAGNOSIS — Z9079 Acquired absence of other genital organ(s): Secondary | ICD-10-CM

## 2021-10-12 DIAGNOSIS — Z90722 Acquired absence of ovaries, bilateral: Secondary | ICD-10-CM

## 2021-10-12 DIAGNOSIS — C538 Malignant neoplasm of overlapping sites of cervix uteri: Secondary | ICD-10-CM

## 2021-10-12 DIAGNOSIS — C539 Malignant neoplasm of cervix uteri, unspecified: Secondary | ICD-10-CM

## 2021-10-12 NOTE — Progress Notes (Signed)
Gynecologic Oncology Telehealth Consult Note: Gyn-Onc  I connected with Maria Davidson on 10/12/21 at  4:20 PM EST by telephone and verified that I am speaking with the correct person using two identifiers.  I discussed the limitations, risks, security and privacy concerns of performing an evaluation and management service by telemedicine and the availability of in-person appointments. I also discussed with the patient that there may be a patient responsible charge related to this service. The patient expressed understanding and agreed to proceed.  Other persons participating in the visit and their role in the encounter: none.  Patient's location: home Provider's location: [facility]  Reason for Visit: Follow-up after recent surgery  Treatment History: Oncology History  Cervical cancer ([place])  01/05/2021 Initial Diagnosis   In June, she had a period for 3-6 days with significant back pain and pelvic cramping.  This was more bleeding than she had normally had    06/07/2021 Pathology Results   [id number] Cervical biopsy showed adenocarcinoma    06/09/2021 Procedure   Patient underwent colposcopy on 11/3.  Findings at that time was a fleshy, friable mass occupying most of the cervix measuring 3 x 3 cm with polypoid protrusions.  Cervical os unable to be identified.  Biopsies were taken that revealed at least adenocarcinoma in situ.   06/15/2021 Initial Diagnosis   Cervical cancer ([place])   06/24/2021 PET scan   1. Ill-defined soft tissue and hypermetabolic activity at the expected area of the cervix, compatible with primary malignancy. 2. No evidence of FDG avid metastatic disease in the chest, abdomen or pelvis. 3. Hypermetabolic activity of the left ovary, likely physiologic.   07/01/2021 Cancer Staging   Staging form: Cervix Uteri, AJCC Version 9 - Clinical stage from 07/01/2021: FIGO Stage IB3 (cT1b3, cN0, cM0) - Signed by Heath Lark, MD on 07/01/2021 Stage prefix: Initial diagnosis     07/14/2021 - 08/19/2021 Radiation Therapy   IMRT Declined HDR   10/04/2021 Surgery   Modified radical robotic-assisted laparoscopic total hysterectomy with bilateral salpingectomy, left oophorectomy  Findings: On EUA, cervix 3-4cm, somewhat flush with the left vaginal sidewall, not firm or nodular, no parametrial involvement. On speculum exam, small, <1 cm discoloration around the cervical os that appears c/w tumor vs treated tumor. On intra-abdominal entry, normal upper abdominal survey. Normal omentum, small and large bowel. Somewhat bulbous 8 cm uterus. Right ovary surgically absent. Bilateral tubes normal appearing with some evidence of possibly endosalpingiosis on the left tubes. Normal left ovary. Some mild retroperitoneal fibrosis and edema consistent recent radiation. Some adhesions between the bladder and LUS/Cervix. No obvious extra-cervical evidence of disease.  Given concern for close margin to the cervix on the left and posterior aspect, additional left and posterior vaginal margin taken. On cystoscopy, bladder including dome intact, good efflux noted from bilateral ureteral orifices.    10/04/2021 Pathology Results   A. UTERUS, CERIVX, LEFT OVARY, BILATERAL FALLOPIAN TUBES:  - Invasive adenocarcinoma, high nuclear-grade, involving all quadrants  of the cervix  - Carcinoma invades for a maximum depth of 0.7 cm  - Cervical margins are negative for carcinoma  - Carcinoma involves anterior and posterior lower uterine segments  - Rest of endometrium shows atrophic and metaplastic changes, negative  for carcinoma  - Bilateral fallopian tubes and ovaries are negative for carcinoma  - No evidence of lymphovascular invasion  - See oncology table  - See comment   B. VAGINAL MARGIN, LEFT, EXCISION:  - Negative for carcinoma  No gross cervical lesion noted on  examination of the tissue.  A.   The closest cervical deep margin is 0.3 cm from carcinoma. FINAL MICROSCOPIC DIAGNOSIS:   A.  UTERUS, CERIVX, LEFT OVARY, BILATERAL FALLOPIAN TUBES:  - Invasive adenocarcinoma, high nuclear-grade, involving all quadrants  of the cervix  - Carcinoma invades for a maximum depth of 0.7 cm  - Cervical margins are negative for carcinoma  - Carcinoma involves anterior and posterior lower uterine segments  - Rest of endometrium shows atrophic and metaplastic changes, negative  for carcinoma  - Bilateral fallopian tubes and ovaries are negative for carcinoma  - No evidence of lymphovascular invasion  - See oncology table  - See comment   B. VAGINAL MARGIN, LEFT, EXCISION:  - Negative for carcinoma       COMMENT:   A.  Dr. Saralyn Pilar reviewed the case and concurs with the diagnosis.  A  gross lesion was not identified in the cervix, but the tumor appears to  be less than 2 cm in greatest linear extent based on microscopic  examination.     ONCOLOGY TABLE:   UTERINE CERVIX, CARCINOMA: Resection   Procedure: Total hysterectomy and bilateral salpingo-oophorectomy  Tumor Size: Cannot be determined  Histologic Type: Adenocarcinoma  Histologic Grade: High grade  Stromal Invasion: Present       Depth of stromal invasion (mm): 7 mm  Other Tissue/ Organ: Not applicable  Margins: All margins negative for invasive carcinoma  Margin Status for HSIL or AIS: All margins negative for HSIL or AIS  Lymphovascular invasion: Not identified       Regional Lymph Nodes: Not applicable (no lymph nodes submitted or  found)  Distant sites involved: Not applicable  Pathologic Stage Classification (pTNM, AJCC 8th Edition): pT1b1, pN not  assigned  Ancillary Studies: Can be performed upon request  Representative Tumor Block: A6      Interval History: Patient reports that recovery continues to go well.  She denies any abdominal or pelvic pain.  She began having bowel function on Thursday after surgery.  Has not needed to take any stool softener or laxative.  Describes her stools as soft, not  liquid or hard.  Woke up from surgery with pressure feeling like she needed to have a bowel movement.  She continues to have this feeling although it has improved over the last week.  Denies any urinary symptoms.  Notes very slight vaginal discharge, with which seems to be when she stands.  This not required her to wear a pad, but she has noticed a small amount on her underwear when she goes to the bathroom.  She denies any odor to this discharge.  Denies any bleeding.  Past Medical/Surgical History: Past Medical History:  Diagnosis Date   Cervical cancer ([place])    History of radiation therapy    Cervix 07/14/21-08/19/21-Dr. Gery Pray   Obesity, Class III, BMI 40-49.9 (morbid obesity) ([place])    Ovarian tumor (benign) 12/06/1999    Past Surgical History:  Procedure Laterality Date   CYSTOSCOPY N/A 10/04/2021   Procedure: CYSTOSCOPY;  Surgeon: Lafonda Mosses, MD;  Location: [facility];  Service: Gynecology;  Laterality: N/A;   LAPAROSCOPIC UNILATERAL SALPINGO OOPHERECTOMY     right side - lap   ROBOTIC ASSISTED BILATERAL SALPINGO OOPHERECTOMY N/A 10/04/2021   Procedure: XI ROBOTIC ASSISTED LEFT OOPHORECTOMY;  Surgeon: Lafonda Mosses, MD;  Location: [facility];  Service: Gynecology;  Laterality: N/A;   ROBOTIC ASSISTED LAPAROSCOPIC HYSTERECTOMY AND SALPINGECTOMY N/A 10/04/2021   Procedure: XI ROBOTIC ASSISTED  LAPAROSCOPIC MODIFIED RADICAL HYSTERECTOMY AND BILATERAL SALPINGECTOMY;  Surgeon: Lafonda Mosses, MD;  Location: [facility];  Service: Gynecology;  Laterality: N/A;    Family History  Problem Relation Age of Onset   Cancer Mother 60       uterine cancer   Colon cancer Neg Hx    Breast cancer Neg Hx    Ovarian cancer Neg Hx    Endometrial cancer Neg Hx    Pancreatic cancer Neg Hx    Prostate cancer Neg Hx     Social History   Socioeconomic History   Marital status: Divorced    Spouse name: Not on file   Number of children: 2   Years of education: Not on file   Highest  education level: Not on file  Occupational History   Occupation: [employer]  Tobacco Use   Smoking status: Never   Smokeless tobacco: Never  Vaping Use   Vaping Use: Never used  Substance and Sexual Activity   Alcohol use: Not Currently   Drug use: No   Sexual activity: Yes    Birth control/protection: None  Other Topics Concern   Not on file  Social History Narrative   Not on file   Social Determinants of Health   Financial Resource Strain: Not on file  Food Insecurity: Not on file  Transportation Needs: Not on file  Physical Activity: Not on file  Stress: Not on file  Social Connections: Not on file    Current Medications:  Current Outpatient Medications:    estradiol (CLIMARA) 0.05 mg/24hr patch, Place 1 patch (0.05 mg total) onto the skin once a week. Start 1 week postop, Disp: 12 patch, Rfl: 3   ibuprofen (ADVIL) 800 MG tablet, Take 1 tablet (800 mg total) by mouth every 8 (eight) hours as needed for moderate pain. For AFTER surgery only, Disp: 30 tablet, Rfl: 0   oxyCODONE (OXY IR/ROXICODONE) 5 MG immediate release tablet, Take 1 tablet (5 mg total) by mouth every 4 (four) hours as needed for severe pain. For AFTER surgery only, do not take and drive, Disp: 15 tablet, Rfl: 0   senna-docusate (SENOKOT-S) 8.6-50 MG tablet, Take 2 tablets by mouth at bedtime. For AFTER surgery, do not take if having diarrhea, Disp: 30 tablet, Rfl: 0  Review of Symptoms: Pertinent positives as per HPI.  Physical Exam: There were no vitals taken for this visit. Deferred given limitations of phone visit.  Laboratory & Radiologic Studies: None new  Assessment & Plan: Maria Davidson is a 41 y.o. 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  Patient is doing well and meeting postoperative milestones.  Answered all of her questions related to postoperative recovery and symptoms.  Discussed limitations and  restrictions as well as expectations.  Patient and I previously reviewed her pathology last week.  Some persistent cancer noted on final pathology, margins negative.  We will plan to review her case and review pathology at tumor board for final disposition.  I discussed the assessment and treatment plan with the patient. The patient was provided with an opportunity to ask questions and all were answered. The patient agreed with the plan and demonstrated an understanding of the instructions.   The patient was advised to call back or see an in-person evaluation if the symptoms worsen or if the condition fails to improve as anticipated.   18 minutes of total time was spent for this patient encounter, including  preparation, phone counseling with the patient and coordination of care, and documentation of the encounter.   Jeral Pinch, MD  Division of Gynecologic Oncology  Department of Obstetrics and Gynecology  [facility]

## 2021-10-13 ENCOUNTER — Encounter: Payer: Self-pay | Admitting: Gynecologic Oncology

## 2021-10-13 ENCOUNTER — Telehealth: Payer: Self-pay

## 2021-10-13 NOTE — Telephone Encounter (Signed)
Following up with Maria Davidson this afternoon regadring her mychart message about her incisions. ? ?Maria John, NP has reviewed the images. Advised patient to apply pressure dressing to the incision with the bleeding. The bleeding should stop. If bleeding is persistent she can come to the office and have the incision glued. For her other incision she needs to wear supportive top/pants/binder to help with the tension on the incisions.  ? ?Patient states "The bleeding has stopped. It was just that small amount that leaked out. I think the water pressure on it in the shower may have caused that to happen. For my other incision it is near my belly button and in a sensitive location. I'll try the supportive clothing." ? ?Advised patient to call with any questions or concerns ?

## 2021-10-17 ENCOUNTER — Telehealth: Payer: Self-pay | Admitting: Oncology

## 2021-10-17 ENCOUNTER — Other Ambulatory Visit: Payer: Self-pay | Admitting: Oncology

## 2021-10-17 NOTE — Telephone Encounter (Signed)
Called Maria Davidson and advised her of the [facility] cancer conference recommendations from this morning for close surveillance.  She verbalized understanding and is aware of her next appointment with Dr. Berline Lopes on 10/24/21. ?

## 2021-10-17 NOTE — Progress Notes (Signed)
Gynecologic Oncology Multi-Disciplinary Disposition Conference Note ? ?Date of the Conference: 03/13/223 ? ?Patient Name: Maria Davidson  ?Referring Provider: Dr. Alesia Richards ?Primary GYN Oncologist: Dr. Berline Lopes ? ?Stage/Disposition:  Stage IB3 adenocarcinoma of the cervix. Disposition is to surveillance with close follow up.  ? ?This Multidisciplinary conference took place involving physicians from [place], Medical Oncology, Radiation Oncology, Pathology, Radiology along with the Gynecologic Oncology Nurse Practitioner and Gynecologic Oncology Nurse Navigator.  Comprehensive assessment of the patient's malignancy, staging, need for surgery, chemotherapy, radiation therapy, and need for further testing were reviewed. Supportive measures, both inpatient and following discharge were also discussed. The recommended plan of care is documented. Greater than 35 minutes were spent correlating and coordinating this patient's care.  ?

## 2021-10-18 ENCOUNTER — Encounter: Payer: Self-pay | Admitting: Gynecologic Oncology

## 2021-10-24 ENCOUNTER — Other Ambulatory Visit: Payer: Self-pay

## 2021-10-24 ENCOUNTER — Encounter: Payer: Self-pay | Admitting: Gynecologic Oncology

## 2021-10-24 ENCOUNTER — Inpatient Hospital Stay (HOSPITAL_BASED_OUTPATIENT_CLINIC_OR_DEPARTMENT_OTHER): Payer: Commercial Managed Care - PPO | Admitting: Gynecologic Oncology

## 2021-10-24 VITALS — BP 123/81 | HR 72 | Temp 97.4°F | Resp 20 | Ht 67.01 in | Wt 284.2 lb

## 2021-10-24 DIAGNOSIS — Z90710 Acquired absence of both cervix and uterus: Secondary | ICD-10-CM

## 2021-10-24 DIAGNOSIS — Z90722 Acquired absence of ovaries, bilateral: Secondary | ICD-10-CM

## 2021-10-24 DIAGNOSIS — C539 Malignant neoplasm of cervix uteri, unspecified: Secondary | ICD-10-CM

## 2021-10-24 DIAGNOSIS — Z7189 Other specified counseling: Secondary | ICD-10-CM

## 2021-10-24 NOTE — Progress Notes (Signed)
Gynecologic Oncology Return Clinic Visit ? ?10/24/21 ? ?Reason for Visit: follow-up after surgery, treatment planning ? ?Treatment History: ?Oncology History Overview Note  ?PD-L1 CPS 60% ?  ?Cervical cancer ([place])  ?01/05/2021 Initial Diagnosis  ? In June, she had a period for 3-6 days with significant back pain and pelvic cramping.  This was more bleeding than she had normally had  ?  ?06/07/2021 Pathology Results  ? [id number] Cervical biopsy showed adenocarcinoma  ?  ?06/09/2021 Procedure  ? Patient underwent colposcopy on 11/3.  Findings at that time was a fleshy, friable mass occupying most of the cervix measuring 3 x 3 cm with polypoid protrusions.  Cervical os unable to be identified.  Biopsies were taken that revealed at least adenocarcinoma in situ. ?  ?06/15/2021 Initial Diagnosis  ? Cervical cancer ([place]) ?  ?06/24/2021 PET scan  ? 1. Ill-defined soft tissue and hypermetabolic activity at the expected area of the cervix, compatible with primary malignancy. ?2. No evidence of FDG avid metastatic disease in the chest, abdomen or pelvis. ?3. Hypermetabolic activity of the left ovary, likely physiologic. ?  ?07/01/2021 Cancer Staging  ? Staging form: Cervix Uteri, AJCC Version 9 ?- Clinical stage from 07/01/2021: FIGO Stage IB3 (cT1b3, cN0, cM0) - Signed by Heath Lark, MD on 07/01/2021 ?Stage prefix: Initial diagnosis ? ?  ?07/14/2021 - 08/19/2021 Radiation Therapy  ? IMRT ?Declined HDR ?  ?10/04/2021 Surgery  ? Modified radical robotic-assisted laparoscopic total hysterectomy with bilateral salpingectomy, left oophorectomy ? ?Findings: On EUA, cervix 3-4cm, somewhat flush with the left vaginal sidewall, not firm or nodular, no parametrial involvement. On speculum exam, small, <1 cm discoloration around the cervical os that appears c/w tumor vs treated tumor. On intra-abdominal entry, normal upper abdominal survey. Normal omentum, small and large bowel. Somewhat bulbous 8 cm uterus. Right ovary surgically absent.  Bilateral tubes normal appearing with some evidence of possibly endosalpingiosis on the left tubes. Normal left ovary. Some mild retroperitoneal fibrosis and edema consistent recent radiation. Some adhesions between the bladder and LUS/Cervix. No obvious extra-cervical evidence of disease.  ?Given concern for close margin to the cervix on the left and posterior aspect, additional left and posterior vaginal margin taken. ?On cystoscopy, bladder including dome intact, good efflux noted from bilateral ureteral orifices.  ?  ?10/04/2021 Pathology Results  ? A. 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 ?B. VAGINAL MARGIN, LEFT, EXCISION:  ?- Negative for carcinoma ? ?No gross cervical lesion noted on examination of the tissue. ? ?A.   The closest cervical deep margin is 0.3 cm from carcinoma. ?FINAL MICROSCOPIC DIAGNOSIS:  ? ?A. UTERUS, CERIVX, LEFT OVARY, BILATERAL FALLOPIAN TUBES:  ?- Invasive adenocarcinoma, high nuclear-grade, involving all quadrants  ?of the cervix  ?- Carcinoma invades for a maximum depth of 0.7 cm  ?- Cervical margins are negative for carcinoma  ?- Carcinoma involves anterior and posterior lower uterine segments  ?- Rest of endometrium shows atrophic and metaplastic changes, negative  ?for carcinoma  ?- Bilateral fallopian tubes  and ovaries are negative for carcinoma  ?- No evidence of lymphovascular invasion  ?- See oncology table  ?- See comment  ? ?B. VAGINAL MARGIN, LEFT, EXCISION:  ?-  Negative for carcinoma  ? ? ? ? ? ?COMMENT:  ? ?A.  Dr. Saralyn Pilar reviewed the case and concurs with the diagnosis.  A  ?gross lesion was not identified in the cervix, but the tumor appears to  ?be less than 2 cm in greatest linear extent based on microscopic  ?examination.  ? ? ? ?ONCOLOGY TABLE:  ? ?UTERINE CERVIX, CARCINOMA: Resection  ? ?Procedure: Total hysterectomy and bilateral salpingo-oophorectomy  ?Tumor Size: Cannot be determined  ?Histologic Type: Adenocarcinoma  ?Histologic Grade: High grade  ?Stromal Invasion: Present  ?     Depth of stromal invasion (mm): 7 mm  ?Other Tissue/ Organ: Not applicable  ?Margins: All margins negative for invasive carcinoma  ?Margin Status for HSIL or AIS: All margins negative for HSIL or AIS  ?Lymphovascular invasion: Not identified  ?     Regional Lymph Nodes: Not applicable (no lymph nodes submitted or  ?found)  ?Distant sites involved: Not applicable  ?Pathologic Stage Classification (pTNM, AJCC 8th Edition): pT1b1, pN not  ?assigned  ?Ancillary Studies: Can be performed upon request  ?Representative Tumor Block: A6  ?  ? ?The patient began having vaginal discharge in 10/2020. She underwent pap test in 01/2021 which showed atypical glandular cells, HR HPV positive. Colposcopy was recommended. Patient ultimately decided to wait to see if menstrual cycels beame more regular.  ?  ?She then presented to the emergency department at [facility] in [place] on 10/29 complaining of pelvic pain and lower back pain for a week.  She describes this today as having significant low back pain and stabbing lower abdominal pain.  She also noted cloudy urine with strong odor.  Imaging was obtained during that visit noted below, with no obvious source for abdominal pain.  Patient was ultimately treated for acute cystitis without hematuria.  Urine culture ultimately showed 70,000 colony-forming units of E. coli. ?  ?Patient underwent colposcopy on 11/3.  Findings at that time was a  fleshy, friable mass occupying most of the cervix measuring 3 x 3 cm with polypoid protrusions.  Cervical os unable to be identified.  Biopsies were taken that revealed at least adenocarcinoma in situ. ?  ?At the time of her visit with me, a 5 x 3.5 cm frondular tumor was noted replacing most of the cervix. The patient was initially counseled, given the size of her mass, need for postoperative adjuvant RT, and her BMI, that I recommended proceeding with primary chemotherapy and RT. We had discussed that this would include pelvic radiation with sensitizing cisplatin followed by brachytherapy. Additionally, we discussed the possibility of a modified course of brachytherapy, depending on her exam after completion of pelvic RT, with plan for interval hysterectomy.    ? ?The patient very much wanted to have upfront surgery and sought a second opinion at [facility]. The recommendation from this visit were for primary chemoradiation with consideration of interval hysterectomy after completion of brachytherapy. The patient ultimately decided to undergo primary treatment at [facility].  ? ?After meeting with Drs. Alvy Bimler and Kinard, and after being counseled of improved outcomes with the addition of sensitizing chemotherapy to RT, the patient elected to proceed with RT alone.  ? ?On exam after completion of pelvic radiation, the patient had what appeared to be a smaller area of residual tumor (versus treated tumor)  with significant reduction in the size of visible lesion on her cervix. She very strongly wished to proceed with hysterectomy. I counseled her that I recommended proceeding with a modified course of brachytherapy, as has been our standard practice for patients undergoing adjuvant hysterectomy, so as to decrease the amount of total Gy given to point A and hopefully decrease the morbidity associated with surgery. I reviewed that our data in patients who undergo interval hysterectomy after completion of chemotherapy and  radiation has shown the possibility of improved local control without a change in overall survival. Most studies give standard brachytherapy or decrease the dose of brachytherapy some in those undergoing interval hysterec

## 2021-10-24 NOTE — Patient Instructions (Addendum)
It was good to see you today. You are healing well from surgery. ? ?Remember, no heavy lifting for 6 weeks after surgery and nothing in the vagina for 8+ weeks. ? ?We will have a phone visit in a few weeks to make sure that the left upper incision has healed completely. ? ?To help reduce scar formation, I would suggest using BioSkin, vitamin E ointment, or cocoa butter. ? ?We will plan to have visits every 3 months for the first 2 years. I will do an exam at each visit. We will repeat a pap test once a year. ? ?If you have any symptoms develop between visits (such as vaginal bleeding, discharge, pelvic pain, change to bowel function, unintentional weight loss), please call to see me sooner.  ? ?

## 2021-10-26 ENCOUNTER — Telehealth: Payer: Self-pay | Admitting: Oncology

## 2021-10-26 NOTE — Telephone Encounter (Signed)
Following up with Maria Davidson this morning. Patient inquiring how long it will take for the wound to close, if she should put anything on the incision and what she can do to prevent infection.  ? ?Reviewed with patient that everyone's body heals differently and it will take time for the incision to close. Reviewed infection prevention practices such as hand hygiene - washing hands with soap and water before touching/ re-packing the wound. Use clean supplies our office provided for her to pack the wound only. Cover packed wound with band-aid. Avoid applying anything topically until the wound has healed.  As the wound heals she will not be able to pack it as much, this is expected. Patient verbalized understanding of above information. Advised her to call the office with any questions or concerns. ? ?Patient requesting a return to work note for this Friday 10/28/21. Patient understands that she still has restrictions until 6 weeks after surgery. She is requesting the return to work note be sent to [name] (fax [phone]) and a copy also be sent to her at [email or web address] .[email or web address]  ?

## 2021-10-26 NOTE — Telephone Encounter (Signed)
Return to work note successfully faxed to [employer] at [phone].  ? ?Return to work note successfully emailed to [employer] at [email or web address] .[employer]  ?

## 2021-10-26 NOTE — Telephone Encounter (Signed)
Maria Davidson left a message asking when her incision might close and if she should be putting anything on it to prevent infection. She also asked if she can have her paperwork completed to release her to return to work on 10/28/2021. ?

## 2021-10-27 ENCOUNTER — Encounter: Payer: Self-pay | Admitting: Gynecologic Oncology

## 2021-11-04 ENCOUNTER — Telehealth: Payer: Self-pay | Admitting: Oncology

## 2021-11-04 ENCOUNTER — Encounter: Payer: Self-pay | Admitting: Gynecologic Oncology

## 2021-11-04 NOTE — Telephone Encounter (Signed)
Called Ji and advised that her incisions look good per Joylene John, NP and to keep up what she has been doing for incision care.  Also let her know that she it is ok for her to get a tattoo since she finished radiation in January. She verbalized understanding and agreement. ?

## 2021-11-09 ENCOUNTER — Telehealth: Payer: Self-pay | Admitting: *Deleted

## 2021-11-09 NOTE — Telephone Encounter (Signed)
Spoke with pt this afternoon who stated that she is noticing some small dribbles of liquid coming out of her vagina intermittently.  She thinks its urine but can't be sure. It's colorless, odorless, and does not cause pain. It happens at random time. It does not gush out. She also stated that her bladder still feels full after using the bathroom. Per Joylene John, NP when pt finishes emptying her bladder she can stand up and sit down again for a little bit to be sure the bladder is completely empty.  She also can bladder train by setting a timer to emptying her bladder every 2 hours. Pt also asked how much longer she needs to pack her wound. Per Joylene John, NP pt needs to continue to pack her wound until it starts to heal and she can't pack it any more. Due to the location of the wound it might take a little bit longer to heal. Pt verbalized understanding and did not have any other questions.  ?

## 2021-11-11 ENCOUNTER — Telehealth: Payer: Self-pay | Admitting: Gynecologic Oncology

## 2021-11-11 ENCOUNTER — Telehealth: Payer: Self-pay | Admitting: *Deleted

## 2021-11-11 NOTE — Telephone Encounter (Signed)
Returned call to patient.  She states she had 2 episodes where she felt like she had to have a bowel movement and noticed clear liquid/mucus when wiping from the rectum.  There was no smell or fecal material in the drainage.  She states her last normal bowel movement was 2 days ago and she has intermittent abdominal bloating. She has not had to use any laxatives since right after surgery.  No nausea or vomiting.  Passing flatus.  States she has been tired this week but she has been doing a lot.  She states she has intermittent daily episodes of discharge leaking from the vagina.  No odor to this.  Bladder working without issue.  She continues to pack the upper abdominal incision with packing strip and states initially it was burning with changes but now it does not so she feels like she is healing.  The other 4 incisions have healed.  We discussed reportable signs and symptoms.  All questions answered. Advised patient to begin taking MiraLAX today and over the weekend. We reviewed postoperative restrictions including no heavy lifting for the full 6 weeks and nothing in the vagina for the full 8 weeks. She is advised to call the office for any needs or questions.  ?

## 2021-11-11 NOTE — Telephone Encounter (Signed)
Patient called and stated "I need to talk with either Dr Berline Lopes or Lenna Sciara but some issues I'm having with my stomach and incisions. Starting last night when I wipe I am having a clear, odorless discharge like mucus from the rectum. Almost looks like the packing I was having to use." Explained that the DrTucker was out of the office but Melissa APP is in the office today, and the message would be given to her and someone from the office will call her back. Patient stated "I want Melissa to call me back please, I have been talking with nurses already."  ?

## 2021-11-16 ENCOUNTER — Inpatient Hospital Stay: Payer: Commercial Managed Care - PPO | Attending: Gynecologic Oncology | Admitting: Gynecologic Oncology

## 2021-11-16 DIAGNOSIS — Z90710 Acquired absence of both cervix and uterus: Secondary | ICD-10-CM | POA: Insufficient documentation

## 2021-11-16 DIAGNOSIS — Z7189 Other specified counseling: Secondary | ICD-10-CM

## 2021-11-16 DIAGNOSIS — C539 Malignant neoplasm of cervix uteri, unspecified: Secondary | ICD-10-CM

## 2021-11-16 DIAGNOSIS — Z90722 Acquired absence of ovaries, bilateral: Secondary | ICD-10-CM | POA: Insufficient documentation

## 2021-11-16 DIAGNOSIS — T8131XA Disruption of external operation (surgical) wound, not elsewhere classified, initial encounter: Secondary | ICD-10-CM | POA: Insufficient documentation

## 2021-11-16 DIAGNOSIS — C538 Malignant neoplasm of overlapping sites of cervix uteri: Secondary | ICD-10-CM | POA: Insufficient documentation

## 2021-11-16 NOTE — Progress Notes (Signed)
Gynecologic Oncology Telehealth Consult Note: Gyn-Onc ? ?I connected with Maria Davidson on 11/16/21 at  4:45 PM EDT by telephone and verified that I am speaking with the correct person using two identifiers. ? ?I discussed the limitations, risks, security and privacy concerns of performing an evaluation and management service by telemedicine and the availability of in-person appointments. I also discussed with the patient that there may be a patient responsible charge related to this service. The patient expressed understanding and agreed to proceed. ? ?Other persons participating in the visit and their role in the encounter: None. ? ?Patient's location: Home ?Provider's location: [facility] ? ?Reason for Visit: Follow-up after postoperative visit ? ?Treatment History: ?Oncology History Overview Note  ?PD-L1 CPS 60% ?  ?Cervical cancer ([place])  ?01/05/2021 Initial Diagnosis  ? In June, she had a period for 3-6 days with significant back pain and pelvic cramping.  This was more bleeding than she had normally had  ?  ?06/07/2021 Pathology Results  ? [id number] Cervical biopsy showed adenocarcinoma  ?  ?06/09/2021 Procedure  ? Patient underwent colposcopy on 11/3.  Findings at that time was a fleshy, friable mass occupying most of the cervix measuring 3 x 3 cm with polypoid protrusions.  Cervical os unable to be identified.  Biopsies were taken that revealed at least adenocarcinoma in situ. ?  ?06/15/2021 Initial Diagnosis  ? Cervical cancer ([place]) ?  ?06/24/2021 PET scan  ? 1. Ill-defined soft tissue and hypermetabolic activity at the expected area of the cervix, compatible with primary malignancy. ?2. No evidence of FDG avid metastatic disease in the chest, abdomen or pelvis. ?3. Hypermetabolic activity of the left ovary, likely physiologic. ?  ?07/01/2021 Cancer Staging  ? Staging form: Cervix Uteri, AJCC Version 9 ?- Clinical stage from 07/01/2021: FIGO Stage IB3 (cT1b3, cN0, cM0) - Signed by Heath Lark, MD on  07/01/2021 ?Stage prefix: Initial diagnosis ? ?  ?07/14/2021 - 08/19/2021 Radiation Therapy  ? IMRT ?Declined HDR ?  ?10/04/2021 Surgery  ? Modified radical robotic-assisted laparoscopic total hysterectomy with bilateral salpingectomy, left oophorectomy ? ?Findings: On EUA, cervix 3-4cm, somewhat flush with the left vaginal sidewall, not firm or nodular, no parametrial involvement. On speculum exam, small, <1 cm discoloration around the cervical os that appears c/w tumor vs treated tumor. On intra-abdominal entry, normal upper abdominal survey. Normal omentum, small and large bowel. Somewhat bulbous 8 cm uterus. Right ovary surgically absent. Bilateral tubes normal appearing with some evidence of possibly endosalpingiosis on the left tubes. Normal left ovary. Some mild retroperitoneal fibrosis and edema consistent recent radiation. Some adhesions between the bladder and LUS/Cervix. No obvious extra-cervical evidence of disease.  ?Given concern for close margin to the cervix on the left and posterior aspect, additional left and posterior vaginal margin taken. ?On cystoscopy, bladder including dome intact, good efflux noted from bilateral ureteral orifices.  ?  ?10/04/2021 Pathology Results  ? A. 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 ?B. VAGINAL MARGIN, LEFT, EXCISION:  ?- Negative for carcinoma ? ?  No gross cervical lesion noted on examination of the tissue. ? ?A.   The closest cervical deep margin is 0.3 cm from  carcinoma. ?FINAL MICROSCOPIC DIAGNOSIS:  ? ?A. 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 ?B. VAGINAL MARGIN, LEFT, EXCISION:  ?- Negative for carcinoma  ? ? ? ? ? ?COMMENT:  ? ?A.  Dr. Saralyn Pilar reviewed the case and concurs with the diagnosis.  A  ?gross lesion was not identified in the cervix, but the tumor appears to  ?be less than 2 cm in greatest linear extent based on microscopic  ?examination.  ? ? ? ?ONCOLOGY TABLE:  ? ?UTERINE CERVIX, CARCINOMA: Resection  ? ?Procedure: Total hysterectomy and bilateral salpingo-oophorectomy  ?Tumor Size: Cannot be determined  ?Histologic Type: Adenocarcinoma  ?Histologic Grade: High grade  ?Stromal Invasion: Present  ?     Depth of stromal invasion (mm): 7 mm  ?Other Tissue/ Organ: Not applicable  ?Margins: All margins negative for invasive carcinoma  ?Margin Status for HSIL or AIS: All margins negative for HSIL or AIS  ?Lymphovascular invasion: Not identified  ?     Regional Lymph Nodes: Not applicable (no lymph nodes submitted or  ?found)  ?Distant sites involved: Not applicable  ?Pathologic Stage Classification (pTNM, AJCC 8th Edition): pT1b1, pN not  ?assigned  ?Ancillary Studies: Can be performed upon request  ?Representative Tumor Block: A6  ?  ? ? ?Interval History: ?Light vaginal spotting 3 days. ?Continues to have discharge, which she was having intermittently when I saw her.  Denies odor.  Thinks that it smells like saline and will often feel some come out  although does not see any when she wipes.  She will notice it on her underwear with a faint stain. ?Sometimes feels like has a full bladder sometimes after urinating. Pees normal amount, sits for a little, sometimes voids again and then feels like its emptied. Urinating normal amount.  ?Reports normal bowel function.  ?Denies any menopausal symptoms including hot flashes. ? ?Past Medical/Surgical History: ?Past Medical History:  ?Diagnosis Date  ? Cervical cancer ([place])   ? History of radiation therapy   ? Cervix 07/14/21-08/19/21-Dr. Gery Pray  ? Obesity, Class III, BMI 40-49.9 (morbid obesity) ([place])   ? Ovarian tumor (benign) 12/06/1999  ? ? ?Past Surgical History:  ?Procedure Laterality Date  ? CYSTOSCOPY N/A 10/04/2021  ? Procedure: CYSTOSCOPY;  Surgeon: Lafonda Mosses, MD;  Location: [facility];  Service: Gynecology;  Laterality: N/A;  ? LAPAROSCOPIC UNILATERAL SALPINGO OOPHERECTOMY    ? right side - lap  ? ROBOTIC ASSISTED BILATERAL SALPINGO OOPHERECTOMY N/A 10/04/2021  ? Procedure: XI ROBOTIC ASSISTED LEFT OOPHORECTOMY;  Surgeon: Lafonda Mosses, MD;  Location: [facility];  Service: Gynecology;  Laterality: N/A;  ? ROBOTIC ASSISTED LAPAROSCOPIC HYSTERECTOMY AND SALPINGECTOMY N/A 10/04/2021  ? Procedure: XI ROBOTIC ASSISTED LAPAROSCOPIC MODIFIED RADICAL HYSTERECTOMY AND BILATERAL SALPINGECTOMY;  Surgeon: Lafonda Mosses, MD;  Location: [facility];  Service: Gynecology;  Laterality: N/A;  ? ? ?Family History  ?Problem Relation  Age of Onset  ? Cancer Mother 26  ?     uterine cancer  ? Colon cancer Neg Hx   ? Breast cancer Neg Hx   ? Ovarian cancer Neg Hx   ? Endometrial cancer Neg Hx   ? Pancreatic cancer Neg Hx   ? Prostate cancer Neg Hx   ? ? ?Social History  ? ?Socioeconomic History  ? Marital status: Divorced  ?  Spouse name: Not on file  ? Number of children: 2  ? Years of education: Not on file  ? Highest education level: Not on file  ?Occupational History  ? Occupation: [employer]  ?Tobacco Use  ? Smoking  status: Never  ? Smokeless tobacco: Never  ?Vaping Use  ? Vaping Use: Never used  ?Substance and Sexual Activity  ? Alcohol use: Not Currently  ? Drug use: No  ? Sexual activity: Yes  ?  Birth control/protection: None

## 2021-11-17 ENCOUNTER — Inpatient Hospital Stay: Payer: Commercial Managed Care - PPO

## 2021-11-17 ENCOUNTER — Inpatient Hospital Stay (HOSPITAL_BASED_OUTPATIENT_CLINIC_OR_DEPARTMENT_OTHER): Payer: Commercial Managed Care - PPO | Admitting: Gynecologic Oncology

## 2021-11-17 ENCOUNTER — Encounter: Payer: Self-pay | Admitting: Gynecologic Oncology

## 2021-11-17 ENCOUNTER — Encounter (HOSPITAL_COMMUNITY): Payer: Self-pay | Admitting: Gynecologic Oncology

## 2021-11-17 ENCOUNTER — Other Ambulatory Visit: Payer: Self-pay

## 2021-11-17 VITALS — BP 135/92 | HR 72 | Temp 97.7°F | Resp 18 | Ht 67.0 in | Wt 284.0 lb

## 2021-11-17 DIAGNOSIS — C539 Malignant neoplasm of cervix uteri, unspecified: Secondary | ICD-10-CM

## 2021-11-17 DIAGNOSIS — T8131XA Disruption of external operation (surgical) wound, not elsewhere classified, initial encounter: Secondary | ICD-10-CM | POA: Diagnosis present

## 2021-11-17 DIAGNOSIS — Z90722 Acquired absence of ovaries, bilateral: Secondary | ICD-10-CM

## 2021-11-17 DIAGNOSIS — C538 Malignant neoplasm of overlapping sites of cervix uteri: Secondary | ICD-10-CM | POA: Diagnosis present

## 2021-11-17 DIAGNOSIS — Z90710 Acquired absence of both cervix and uterus: Secondary | ICD-10-CM

## 2021-11-17 DIAGNOSIS — T8132XA Disruption of internal operation (surgical) wound, not elsewhere classified, initial encounter: Secondary | ICD-10-CM

## 2021-11-17 LAB — CBC (CANCER CENTER ONLY)
HCT: 34.5 % — ABNORMAL LOW (ref 36.0–46.0)
Hemoglobin: 11 g/dL — ABNORMAL LOW (ref 12.0–15.0)
MCH: 27.4 pg (ref 26.0–34.0)
MCHC: 31.9 g/dL (ref 30.0–36.0)
MCV: 86 fL (ref 80.0–100.0)
Platelet Count: 248 10*3/uL (ref 150–400)
RBC: 4.01 MIL/uL (ref 3.87–5.11)
RDW: 13.9 % (ref 11.5–15.5)
WBC Count: 4.8 10*3/uL (ref 4.0–10.5)
nRBC: 0 % (ref 0.0–0.2)

## 2021-11-17 LAB — BASIC METABOLIC PANEL - CANCER CENTER ONLY
Anion gap: 8 (ref 5–15)
BUN: 10 mg/dL (ref 6–20)
CO2: 28 mmol/L (ref 22–32)
Calcium: 9.3 mg/dL (ref 8.9–10.3)
Chloride: 105 mmol/L (ref 98–111)
Creatinine: 0.69 mg/dL (ref 0.44–1.00)
GFR, Estimated: >60 mL/min (ref >60)
Glucose, Bld: 89 mg/dL (ref 70–99)
Potassium: 4 mmol/L (ref 3.5–5.1)
Sodium: 141 mmol/L (ref 135–145)

## 2021-11-17 NOTE — Patient Instructions (Signed)
We will plan to check lab work today and contact you with the results.  ? ?You may receive a phone call from the hospital to discuss instructions for arrival tomorrow etc. We have you scheduled for tomorrow at 07:30 am at [facility] for vaginal cuff repair with additional sutures to be placed. ? ?Dr. Berline Lopes will call you in around 1 hour and if the date needs to be adjusted for the procedure, this can be done.  ?

## 2021-11-17 NOTE — H&P (View-Only) (Signed)
Gynecologic Oncology Return Clinic Visit ? ?11/17/21 ? ?Reason for Visit: vaginal discharge ? ?Treatment History: ?Oncology History Overview Note  ?PD-L1 CPS 60% ?  ?Cervical cancer ([facility])  ?01/05/2021 Initial Diagnosis  ? In June, she had a period for 3-6 days with significant back pain and pelvic cramping.  This was more bleeding than she had normally had  ?  ?06/07/2021 Pathology Results  ? [id number] Cervical biopsy showed adenocarcinoma  ?  ?06/09/2021 Procedure  ? Patient underwent colposcopy on 11/3.  Findings at that time was a fleshy, friable mass occupying most of the cervix measuring 3 x 3 cm with polypoid protrusions.  Cervical os unable to be identified.  Biopsies were taken that revealed at least adenocarcinoma in situ. ?  ?06/15/2021 Initial Diagnosis  ? Cervical cancer ([facility]) ?  ?06/24/2021 PET scan  ? 1. Ill-defined soft tissue and hypermetabolic activity at the expected area of the cervix, compatible with primary malignancy. ?2. No evidence of FDG avid metastatic disease in the chest, abdomen or pelvis. ?3. Hypermetabolic activity of the left ovary, likely physiologic. ?  ?07/01/2021 Cancer Staging  ? Staging form: Cervix Uteri, AJCC Version 9 ?- Clinical stage from 07/01/2021: FIGO Stage IB3 (cT1b3, cN0, cM0) - Signed by Heath Lark, MD on 07/01/2021 ?Stage prefix: Initial diagnosis ? ?  ?07/14/2021 - 08/19/2021 Radiation Therapy  ? IMRT ?Declined HDR ?  ?10/04/2021 Surgery  ? Modified radical robotic-assisted laparoscopic total hysterectomy with bilateral salpingectomy, left oophorectomy ? ?Findings: On EUA, cervix 3-4cm, somewhat flush with the left vaginal sidewall, not firm or nodular, no parametrial involvement. On speculum exam, small, <1 cm discoloration around the cervical os that appears c/w tumor vs treated tumor. On intra-abdominal entry, normal upper abdominal survey. Normal omentum, small and large bowel. Somewhat bulbous 8 cm uterus. Right ovary surgically absent. Bilateral tubes normal  appearing with some evidence of possibly endosalpingiosis on the left tubes. Normal left ovary. Some mild retroperitoneal fibrosis and edema consistent recent radiation. Some adhesions between the bladder and LUS/Cervix. No obvious extra-cervical evidence of disease.  ?Given concern for close margin to the cervix on the left and posterior aspect, additional left and posterior vaginal margin taken. ?On cystoscopy, bladder including dome intact, good efflux noted from bilateral ureteral orifices.  ?  ?10/04/2021 Pathology Results  ? A. 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 ?B. VAGINAL MARGIN, LEFT, EXCISION:  ?- Negative for carcinoma ? ?No gross cervical lesion noted on examination of the tissue. ? ?A.   The closest cervical deep margin is 0.3 cm from carcinoma. ?FINAL MICROSCOPIC DIAGNOSIS:  ? ?A. UTERUS, CERIVX, LEFT OVARY, BILATERAL FALLOPIAN TUBES:  ?- Invasive adenocarcinoma, high nuclear-grade, involving all quadrants  ?of the cervix  ?- Carcinoma invades for a maximum depth of 0.7 cm  ?- Cervical margins are negative for carcinoma  ?- Carcinoma involves anterior and posterior lower uterine segments  ?- Rest of endometrium shows atrophic and metaplastic changes, negative  ?for carcinoma  ?- Bilateral fallopian tubes and ovaries are  negative for carcinoma  ?- No evidence of lymphovascular invasion  ?- See oncology table  ?- See comment  ? ?B. VAGINAL MARGIN, LEFT, EXCISION:  ?- Negative for carcinoma   ? ? ? ? ? ?COMMENT:  ? ?A.  Dr. Saralyn Pilar reviewed the case and concurs with the diagnosis.  A  ?gross lesion was not identified in the cervix, but the tumor appears to  ?be less than 2 cm in greatest linear extent based on microscopic  ?examination.  ? ? ? ?ONCOLOGY TABLE:  ? ?UTERINE CERVIX, CARCINOMA: Resection  ? ?Procedure: Total hysterectomy and bilateral salpingo-oophorectomy  ?Tumor Size: Cannot be determined  ?Histologic Type: Adenocarcinoma  ?Histologic Grade: High grade  ?Stromal Invasion: Present  ?     Depth of stromal invasion (mm): 7 mm  ?Other Tissue/ Organ: Not applicable  ?Margins: All margins negative for invasive carcinoma  ?Margin Status for HSIL or AIS: All margins negative for HSIL or AIS  ?Lymphovascular invasion: Not identified  ?     Regional Lymph Nodes: Not applicable (no lymph nodes submitted or  ?found)  ?Distant sites involved: Not applicable  ?Pathologic Stage Classification (pTNM, AJCC 8th Edition): pT1b1, pN not  ?assigned  ?Ancillary Studies: Can be performed upon request  ?Representative Tumor Block: A6  ?  ? ? ?Interval History: ?Doing well.  Continues to have discharge which she describes as leaking frequently.  No changes from our conversation yesterday.  Denies pain.  Denies fevers or chills. ? ?Past Medical/Surgical History: ?Past Medical History:  ?Diagnosis Date  ? Cervical cancer ([place])   ? History of radiation therapy   ? Cervix 07/14/21-08/19/21-Dr. Gery Pray  ? Obesity, Class III, BMI 40-49.9 (morbid obesity) ([place])   ? Ovarian tumor (benign) 12/06/1999  ? ? ?Past Surgical History:  ?Procedure Laterality Date  ? CYSTOSCOPY N/A 10/04/2021  ? Procedure: CYSTOSCOPY;  Surgeon: Lafonda Mosses, MD;  Location: [facility];  Service: Gynecology;  Laterality: N/A;  ? LAPAROSCOPIC UNILATERAL SALPINGO OOPHERECTOMY    ? right side - lap  ? ROBOTIC ASSISTED BILATERAL SALPINGO OOPHERECTOMY N/A 10/04/2021  ? Procedure: XI ROBOTIC ASSISTED LEFT OOPHORECTOMY;  Surgeon: Lafonda Mosses, MD;  Location: [facility];  Service: Gynecology;  Laterality: N/A;  ? ROBOTIC ASSISTED LAPAROSCOPIC HYSTERECTOMY AND SALPINGECTOMY N/A 10/04/2021  ? Procedure: XI ROBOTIC ASSISTED LAPAROSCOPIC MODIFIED RADICAL HYSTERECTOMY AND BILATERAL SALPINGECTOMY;  Surgeon: Lafonda Mosses, MD;  Location: [facility];  Service: Gynecology;  Laterality: N/A;  ? ? ?Family History  ?Problem Relation Age of Onset  ? Cancer Mother 33  ?     uterine cancer  ? Colon cancer Neg Hx   ? Breast cancer Neg Hx   ? Ovarian cancer Neg Hx   ? Endometrial cancer Neg Hx   ? Pancreatic cancer Neg Hx   ? Prostate cancer Neg Hx   ? ? ?Social History  ? ?Socioeconomic History  ? Marital status: Divorced  ?  Spouse name: Not on file  ? Number of children: 2  ? Years of education: Not on file  ? Highest education level: Not on file  ?Occupational History  ? Occupation: [employer]er  ?Tobacco Use  ? Smoking status: Never  ? Smokeless tobacco: Never  ?Vaping Use  ? Vaping Use: Never used  ?Substance and Sexual Activity  ? Alcohol use: Not Currently  ? Drug use: No  ? Sexual activity: Yes  ?  Birth control/protection: None  ?Other Topics Concern  ? Not on file  ?Social History  Narrative  ? Not on file  ? ?Social Determinants of Health  ? ?Financial Resource Strain: Not on file  ?Food Insecurity: Not on file  ?Transportation Needs: Not on file  ?Physical Activity: Not on file  ?Stress: Not on file  ?Social Connections: Not on file  ? ? ?Current Medications: ? ?Current Outpatient Medications:  ?  estradiol (CLIMARA) 0.05 mg/24hr patch, Place 1 patch (0.05 mg total) onto the skin once a week. Start 1 week postop (Patient not taking: Reported on 10/18/2021), Disp: 12 patch, Rfl: 3 ? ?Review of Systems: ?Denies appetite changes, fevers, chills, fatigue, unexplained weight changes. ?Denies hearing loss, neck lumps or masses, mouth sores, ringing in ears or voice changes. ?Denies cough or wheezing.  Denies shortness of breath. ?Denies chest pain or palpitations. Denies  leg swelling. ?Denies abdominal distention, pain, blood in stools, constipation, diarrhea, nausea, vomiting, or early satiety. ?Denies pain with intercourse, dysuria, frequency, hematuria or incontinence. ?Denies h

## 2021-11-17 NOTE — Progress Notes (Signed)
Surgery orders requested via Epic inbox. °

## 2021-11-17 NOTE — Progress Notes (Signed)
Gynecologic Oncology Return Clinic Visit ? ?11/17/21 ? ?Reason for Visit: vaginal discharge ? ?Treatment History: ?Oncology History Overview Note  ?PD-L1 CPS 60% ?  ?Cervical cancer ([place])  ?01/05/2021 Initial Diagnosis  ? In June, she had a period for 3-6 days with significant back pain and pelvic cramping.  This was more bleeding than she had normally had  ?  ?06/07/2021 Pathology Results  ? [id number] Cervical biopsy showed adenocarcinoma  ?  ?06/09/2021 Procedure  ? Patient underwent colposcopy on 11/3.  Findings at that time was a fleshy, friable mass occupying most of the cervix measuring 3 x 3 cm with polypoid protrusions.  Cervical os unable to be identified.  Biopsies were taken that revealed at least adenocarcinoma in situ. ?  ?06/15/2021 Initial Diagnosis  ? Cervical cancer ([place]) ?  ?06/24/2021 PET scan  ? 1. Ill-defined soft tissue and hypermetabolic activity at the expected area of the cervix, compatible with primary malignancy. ?2. No evidence of FDG avid metastatic disease in the chest, abdomen or pelvis. ?3. Hypermetabolic activity of the left ovary, likely physiologic. ?  ?07/01/2021 Cancer Staging  ? Staging form: Cervix Uteri, AJCC Version 9 ?- Clinical stage from 07/01/2021: FIGO Stage IB3 (cT1b3, cN0, cM0) - Signed by Heath Lark, MD on 07/01/2021 ?Stage prefix: Initial diagnosis ? ?  ?07/14/2021 - 08/19/2021 Radiation Therapy  ? IMRT ?Declined HDR ?  ?10/04/2021 Surgery  ? Modified radical robotic-assisted laparoscopic total hysterectomy with bilateral salpingectomy, left oophorectomy ? ?Findings: On EUA, cervix 3-4cm, somewhat flush with the left vaginal sidewall, not firm or nodular, no parametrial involvement. On speculum exam, small, <1 cm discoloration around the cervical os that appears c/w tumor vs treated tumor. On intra-abdominal entry, normal upper abdominal survey. Normal omentum, small and large bowel. Somewhat bulbous 8 cm uterus. Right ovary surgically absent. Bilateral tubes normal  appearing with some evidence of possibly endosalpingiosis on the left tubes. Normal left ovary. Some mild retroperitoneal fibrosis and edema consistent recent radiation. Some adhesions between the bladder and LUS/Cervix. No obvious extra-cervical evidence of disease.  ?Given concern for close margin to the cervix on the left and posterior aspect, additional left and posterior vaginal margin taken. ?On cystoscopy, bladder including dome intact, good efflux noted from bilateral ureteral orifices.  ?  ?10/04/2021 Pathology Results  ? A. 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 ?B. VAGINAL MARGIN, LEFT, EXCISION:  ?- Negative for carcinoma ? ?No gross cervical lesion noted on examination of the tissue. ? ?A.   The closest cervical deep margin is 0.3 cm from carcinoma. ?FINAL MICROSCOPIC DIAGNOSIS:  ? ?A. UTERUS, CERIVX, LEFT OVARY, BILATERAL FALLOPIAN TUBES:  ?- Invasive adenocarcinoma, high nuclear-grade, involving all quadrants  ?of the cervix  ?- Carcinoma invades for a maximum depth of 0.7 cm  ?- Cervical margins are negative for carcinoma  ?- Carcinoma involves anterior and posterior lower uterine segments  ?- Rest of endometrium shows atrophic and metaplastic changes, negative  ?for carcinoma  ?- Bilateral fallopian tubes and ovaries are  negative for carcinoma  ?- No evidence of lymphovascular invasion  ?- See oncology table  ?- See comment  ? ?B. VAGINAL MARGIN, LEFT, EXCISION:  ?- Negative for carcinoma   ? ? ? ? ? ?COMMENT:  ? ?A.  Dr. Saralyn Pilar reviewed the case and concurs with the diagnosis.  A  ?gross lesion was not identified in the cervix, but the tumor appears to  ?be less than 2 cm in greatest linear extent based on microscopic  ?examination.  ? ? ? ?ONCOLOGY TABLE:  ? ?UTERINE CERVIX, CARCINOMA: Resection  ? ?Procedure: Total hysterectomy and bilateral salpingo-oophorectomy  ?Tumor Size: Cannot be determined  ?Histologic Type: Adenocarcinoma  ?Histologic Grade: High grade  ?Stromal Invasion: Present  ?     Depth of stromal invasion (mm): 7 mm  ?Other Tissue/ Organ: Not applicable  ?Margins: All margins negative for invasive carcinoma  ?Margin Status for HSIL or AIS: All margins negative for HSIL or AIS  ?Lymphovascular invasion: Not identified  ?     Regional Lymph Nodes: Not applicable (no lymph nodes submitted or  ?found)  ?Distant sites involved: Not applicable  ?Pathologic Stage Classification (pTNM, AJCC 8th Edition): pT1b1, pN not  ?assigned  ?Ancillary Studies: Can be performed upon request  ?Representative Tumor Block: A6  ?  ? ? ?Interval History: ?Doing well.  Continues to have discharge which she describes as leaking frequently.  No changes from our conversation yesterday.  Denies pain.  Denies fevers or chills. ? ?Past Medical/Surgical History: ?Past Medical History:  ?Diagnosis Date  ? Cervical cancer ([place])   ? History of radiation therapy   ? Cervix 07/14/21-08/19/21-Dr. Gery Pray  ? Obesity, Class III, BMI 40-49.9 (morbid obesity) ([place])   ? Ovarian tumor (benign) 12/06/1999  ? ? ?Past Surgical History:  ?Procedure Laterality Date  ? CYSTOSCOPY N/A 10/04/2021  ? Procedure: CYSTOSCOPY;  Surgeon: Lafonda Mosses, MD;  Location[facility];  Service: Gynecology;  Laterality: N/A;  ? LAPAROSCOPIC UNILATERAL SALPINGO OOPHERECTOMY    ? right side - lap  ? ROBOTIC ASSISTED BILATERAL SALPINGO OOPHERECTOMY N/A 10/04/2021  ? Procedure: XI ROBOTIC ASSISTED LEFT OOPHORECTOMY;  Surgeon: Lafonda Mosses, MD;  Location[facility];  Service: Gynecology;  Laterality: N/A;  ? ROBOTIC ASSISTED LAPAROSCOPIC HYSTERECTOMY AND SALPINGECTOMY N/A 10/04/2021  ? Procedure: XI ROBOTIC ASSISTED LAPAROSCOPIC MODIFIED RADICAL HYSTERECTOMY AND BILATERAL SALPINGECTOMY;  Surgeon: Lafonda Mosses, MD;  [facility];  Service: Gynecology;  Laterality: N/A;  ? ? ?Family History  ?Problem Relation Age of Onset  ? Cancer Mother 62  ?     uterine cancer  ? Colon cancer Neg Hx   ? Breast cancer Neg Hx   ? Ovarian cancer Neg Hx   ? Endometrial cancer Neg Hx   ? Pancreatic cancer Neg Hx   ? Prostate cancer Neg Hx   ? ? ?Social History  ? ?Socioeconomic History  ? Marital status: Divorced  ?  Spouse name: Not on file  ? Number of children: 2  ? Years of education: Not on file  ? Highest education level: Not on file  ?Occupational History  ? Occupation: [employer]  ?Tobacco Use  ? Smoking status: Never  ? Smokeless tobacco: Never  ?Vaping Use  ? Vaping Use: Never used  ?Substance and Sexual Activity  ? Alcohol use: Not Currently  ? Drug use: No  ? Sexual activity: Yes  ?  Birth control/protection: None  ?Other Topics Concern  ? Not on file  ?Social History  Narrative  ? Not on file  ? ?Social Determinants of Health  ? ?Financial Resource Strain: Not on file  ?Food Insecurity: Not on file  ?Transportation Needs: Not on file  ?Physical Activity: Not on file  ?Stress: Not on file  ?Social Connections: Not on file  ? ? ?Current Medications: ? ?Current Outpatient Medications:  ?  estradiol (CLIMARA) 0.05 mg/24hr patch, Place 1 patch (0.05 mg total) onto the skin once a week. Start 1 week postop (Patient not taking: Reported on 10/18/2021), Disp: 12 patch, Rfl: 3 ? ?Review of Systems: ?Denies appetite changes, fevers, chills, fatigue, unexplained weight changes. ?Denies hearing loss, neck lumps or masses, mouth sores, ringing in ears or voice changes. ?Denies cough or wheezing.  Denies shortness of breath. ?Denies chest pain or palpitations. Denies  leg swelling. ?Denies abdominal distention, pain, blood in stools, constipation, diarrhea, nausea, vomiting, or early satiety. ?Denies pain with intercourse, dysuria, frequency, hematuria or incontinence. ?Denies h

## 2021-11-18 ENCOUNTER — Encounter (HOSPITAL_COMMUNITY)
Admission: RE | Disposition: A | Discharge status: Home / Self Care | Payer: Self-pay | Source: Home / Self Care | Attending: Gynecologic Oncology

## 2021-11-18 ENCOUNTER — Inpatient Hospital Stay (HOSPITAL_COMMUNITY): Payer: Commercial Managed Care - PPO | Admitting: Certified Registered"

## 2021-11-18 ENCOUNTER — Telehealth: Payer: Self-pay

## 2021-11-18 ENCOUNTER — Inpatient Hospital Stay (HOSPITAL_BASED_OUTPATIENT_CLINIC_OR_DEPARTMENT_OTHER): Payer: Commercial Managed Care - PPO | Admitting: Certified Registered"

## 2021-11-18 ENCOUNTER — Other Ambulatory Visit: Payer: Self-pay

## 2021-11-18 ENCOUNTER — Encounter (HOSPITAL_COMMUNITY): Payer: Self-pay | Admitting: Gynecologic Oncology

## 2021-11-18 ENCOUNTER — Ambulatory Visit (HOSPITAL_COMMUNITY)
Admission: RE | Admit: 2021-11-18 | Discharge: 2021-11-18 | Disposition: A | Discharge status: Home / Self Care | Payer: Commercial Managed Care - PPO | Attending: Gynecologic Oncology | Admitting: Gynecologic Oncology

## 2021-11-18 DIAGNOSIS — T8132XA Disruption of internal operation (surgical) wound, not elsewhere classified, initial encounter: Secondary | ICD-10-CM | POA: Diagnosis not present

## 2021-11-18 DIAGNOSIS — T8131XA Disruption of external operation (surgical) wound, not elsewhere classified, initial encounter: Secondary | ICD-10-CM | POA: Diagnosis not present

## 2021-11-18 DIAGNOSIS — X58XXXA Exposure to other specified factors, initial encounter: Secondary | ICD-10-CM | POA: Diagnosis not present

## 2021-11-18 DIAGNOSIS — Z90722 Acquired absence of ovaries, bilateral: Secondary | ICD-10-CM | POA: Diagnosis not present

## 2021-11-18 DIAGNOSIS — N898 Other specified noninflammatory disorders of vagina: Secondary | ICD-10-CM | POA: Diagnosis present

## 2021-11-18 DIAGNOSIS — C539 Malignant neoplasm of cervix uteri, unspecified: Secondary | ICD-10-CM | POA: Diagnosis not present

## 2021-11-18 DIAGNOSIS — Z90710 Acquired absence of both cervix and uterus: Secondary | ICD-10-CM

## 2021-11-18 DIAGNOSIS — Z6841 Body Mass Index (BMI) 40.0 and over, adult: Secondary | ICD-10-CM | POA: Insufficient documentation

## 2021-11-18 DIAGNOSIS — T81328A Disruption or dehiscence of closure of other specified internal operation (surgical) wound, initial encounter: Secondary | ICD-10-CM

## 2021-11-18 SURGERY — EXAM UNDER ANESTHESIA
Anesthesia: General

## 2021-11-18 MED ORDER — KETOROLAC TROMETHAMINE 15 MG/ML IJ SOLN: 15.0000 mg | INTRAMUSCULAR | Status: DC

## 2021-11-18 MED ORDER — FENTANYL CITRATE (PF) 250 MCG/5ML IJ SOLN
INTRAMUSCULAR | Status: DC | PRN
  Administered 2021-11-18: 100 ug via INTRAVENOUS
  Administered 2021-11-18 (×2): 50 ug via INTRAVENOUS

## 2021-11-18 MED ORDER — LACTATED RINGERS IV SOLN: INTRAVENOUS | Status: DC

## 2021-11-18 MED ORDER — SILVER NITRATE-POT NITRATE 75-25 % EX MISC
CUTANEOUS | Status: AC
  Filled 2021-11-18: qty 10

## 2021-11-18 MED ORDER — HYDROMORPHONE HCL 1 MG/ML IJ SOLN: 0.2500 mg | INTRAMUSCULAR | Status: DC | PRN

## 2021-11-18 MED ORDER — MIDAZOLAM HCL 2 MG/2ML IJ SOLN
INTRAMUSCULAR | Status: AC
  Filled 2021-11-18: qty 2

## 2021-11-18 MED ORDER — ESTRADIOL 0.1 MG/GM VA CREA: TOPICAL_CREAM | VAGINAL | 1 refills | Status: DC

## 2021-11-18 MED ORDER — OXYCODONE HCL 5 MG/5ML PO SOLN: 5.0000 mg | Freq: Once | ORAL | Status: DC | PRN

## 2021-11-18 MED ORDER — OXYCODONE HCL 5 MG PO TABS: 5.0000 mg | ORAL_TABLET | Freq: Once | ORAL | Status: DC | PRN

## 2021-11-18 MED ORDER — SCOPOLAMINE 1 MG/3DAYS TD PT72
1.0000 | MEDICATED_PATCH | TRANSDERMAL | Status: DC
  Administered 2021-11-18: 1.5 mg via TRANSDERMAL
  Filled 2021-11-18: qty 1

## 2021-11-18 MED ORDER — PROPOFOL 10 MG/ML IV BOLUS
INTRAVENOUS | Status: DC | PRN
  Administered 2021-11-18: 200 mg via INTRAVENOUS

## 2021-11-18 MED ORDER — KETOROLAC TROMETHAMINE 30 MG/ML IJ SOLN: 30.0000 mg | Freq: Once | INTRAMUSCULAR | Status: DC | PRN

## 2021-11-18 MED ORDER — KETOROLAC TROMETHAMINE 30 MG/ML IJ SOLN
INTRAMUSCULAR | Status: DC | PRN
  Administered 2021-11-18: 30 mg via INTRAVENOUS

## 2021-11-18 MED ORDER — ESTRADIOL 0.1 MG/GM VA CREA
TOPICAL_CREAM | VAGINAL | Status: DC | PRN
  Administered 2021-11-18: 1 via VAGINAL

## 2021-11-18 MED ORDER — FENTANYL CITRATE (PF) 100 MCG/2ML IJ SOLN
INTRAMUSCULAR | Status: AC
Start: 2021-11-18 — End: ?
  Filled 2021-11-18: qty 2

## 2021-11-18 MED ORDER — ORAL CARE MOUTH RINSE
15.0000 mL | Freq: Once | OROMUCOSAL | Status: AC
  Administered 2021-11-18: 15 mL via OROMUCOSAL

## 2021-11-18 MED ORDER — HEPARIN SODIUM (PORCINE) 5000 UNIT/ML IJ SOLN
5000.0000 [IU] | INTRAMUSCULAR | Status: AC
  Administered 2021-11-18: 5000 [IU] via SUBCUTANEOUS
  Filled 2021-11-18: qty 1

## 2021-11-18 MED ORDER — CEFAZOLIN IN SODIUM CHLORIDE 3-0.9 GM/100ML-% IV SOLN
3.0000 g | INTRAVENOUS | Status: AC
  Administered 2021-11-18: 3 g via INTRAVENOUS
  Filled 2021-11-18: qty 100

## 2021-11-18 MED ORDER — DEXAMETHASONE SODIUM PHOSPHATE 10 MG/ML IJ SOLN
INTRAMUSCULAR | Status: DC | PRN
  Administered 2021-11-18: 10 mg via INTRAVENOUS

## 2021-11-18 MED ORDER — ACETAMINOPHEN 160 MG/5ML PO SOLN
1000.0000 mg | ORAL | Status: AC
  Administered 2021-11-18: 1000 mg via ORAL
  Filled 2021-11-18: qty 40.6

## 2021-11-18 MED ORDER — 0.9 % SODIUM CHLORIDE (POUR BTL) OPTIME
TOPICAL | Status: DC | PRN
  Administered 2021-11-18: 1000 mL

## 2021-11-18 MED ORDER — LIDOCAINE HCL (PF) 2 % IJ SOLN
INTRAMUSCULAR | Status: AC
  Filled 2021-11-18: qty 5

## 2021-11-18 MED ORDER — MIDAZOLAM HCL 5 MG/5ML IJ SOLN
INTRAMUSCULAR | Status: DC | PRN
  Administered 2021-11-18: 2 mg via INTRAVENOUS

## 2021-11-18 MED ORDER — ONDANSETRON HCL 4 MG/2ML IJ SOLN: 4.0000 mg | Freq: Once | INTRAMUSCULAR | Status: DC | PRN

## 2021-11-18 MED ORDER — CHLORHEXIDINE GLUCONATE 0.12 % MT SOLN: 15.0000 mL | Freq: Once | OROMUCOSAL | Status: AC

## 2021-11-18 MED ORDER — KETOROLAC TROMETHAMINE 30 MG/ML IJ SOLN
INTRAMUSCULAR | Status: AC
  Filled 2021-11-18: qty 1

## 2021-11-18 MED ORDER — FENTANYL CITRATE (PF) 100 MCG/2ML IJ SOLN
INTRAMUSCULAR | Status: AC
  Filled 2021-11-18: qty 2

## 2021-11-18 MED ORDER — ONDANSETRON HCL 4 MG/2ML IJ SOLN
INTRAMUSCULAR | Status: AC
  Filled 2021-11-18: qty 2

## 2021-11-18 MED ORDER — DEXAMETHASONE SODIUM PHOSPHATE 10 MG/ML IJ SOLN
INTRAMUSCULAR | Status: AC
  Filled 2021-11-18: qty 1

## 2021-11-18 MED ORDER — DEXAMETHASONE SODIUM PHOSPHATE 4 MG/ML IJ SOLN: 4.0000 mg | INTRAMUSCULAR | Status: DC

## 2021-11-18 MED ORDER — LIDOCAINE 2% (20 MG/ML) 5 ML SYRINGE
INTRAMUSCULAR | Status: DC | PRN
  Administered 2021-11-18: 100 mg via INTRAVENOUS

## 2021-11-18 MED ORDER — LIDOCAINE HCL (PF) 1 % IJ SOLN
INTRAMUSCULAR | Status: AC
  Filled 2021-11-18: qty 30

## 2021-11-18 MED ORDER — PROPOFOL 10 MG/ML IV BOLUS
INTRAVENOUS | Status: AC
  Filled 2021-11-18: qty 20

## 2021-11-18 MED ORDER — DEXMEDETOMIDINE (PRECEDEX) IN NS 20 MCG/5ML (4 MCG/ML) IV SYRINGE
PREFILLED_SYRINGE | INTRAVENOUS | Status: DC | PRN
  Administered 2021-11-18 (×2): 4 ug via INTRAVENOUS

## 2021-11-18 MED ORDER — DEXMEDETOMIDINE (PRECEDEX) IN NS 20 MCG/5ML (4 MCG/ML) IV SYRINGE
PREFILLED_SYRINGE | INTRAVENOUS | Status: AC
  Filled 2021-11-18: qty 5

## 2021-11-18 MED ORDER — ESTRADIOL 0.1 MG/GM VA CREA
TOPICAL_CREAM | VAGINAL | Status: AC
  Filled 2021-11-18: qty 42.5

## 2021-11-18 MED ORDER — ROCURONIUM BROMIDE 10 MG/ML (PF) SYRINGE
PREFILLED_SYRINGE | INTRAVENOUS | Status: AC
  Filled 2021-11-18: qty 10

## 2021-11-18 MED ORDER — ONDANSETRON HCL 4 MG/2ML IJ SOLN
INTRAMUSCULAR | Status: DC | PRN
  Administered 2021-11-18: 4 mg via INTRAVENOUS

## 2021-11-18 SURGICAL SUPPLY — 28 items
BACTOSHIELD CHG 4% 4OZ (MISCELLANEOUS) ×1
BAG COUNTER SPONGE SURGICOUNT (BAG) IMPLANT
CATH ROBINSON RED A/P 16FR (CATHETERS) ×2 IMPLANT
DRAPE SHEET LG 3/4 BI-LAMINATE (DRAPES) ×2 IMPLANT
DRAPE UNDERBUTTOCKS STRL (DISPOSABLE) ×2 IMPLANT
DRSG TELFA 3X8 NADH (GAUZE/BANDAGES/DRESSINGS) ×2 IMPLANT
GAUZE 4X4 16PLY ~~LOC~~+RFID DBL (SPONGE) ×2 IMPLANT
GLOVE BIO SURGEON STRL SZ 6 (GLOVE) ×4 IMPLANT
GOWN STRL REUS W/ TWL LRG LVL3 (GOWN DISPOSABLE) ×2 IMPLANT
GOWN STRL REUS W/TWL LRG LVL3 (GOWN DISPOSABLE) ×4
KIT BASIN OR (CUSTOM PROCEDURE TRAY) ×2 IMPLANT
NDL SPNL 22GX3.5 QUINCKE BK (NEEDLE) ×1 IMPLANT
NEEDLE SPNL 22GX3.5 QUINCKE BK (NEEDLE) ×2 IMPLANT
NS IRRIG 1000ML POUR BTL (IV SOLUTION) ×2 IMPLANT
PACK LITHOTOMY IV (CUSTOM PROCEDURE TRAY) ×2 IMPLANT
PAD DRESSING TELFA 3X8 NADH (GAUZE/BANDAGES/DRESSINGS) ×1 IMPLANT
PAD OB MATERNITY 4.3X12.25 (PERSONAL CARE ITEMS) ×2 IMPLANT
PENCIL SMOKE EVACUATOR (MISCELLANEOUS) IMPLANT
SCRUB CHG 4% DYNA-HEX 4OZ (MISCELLANEOUS) ×1 IMPLANT
SURGILUBE 2OZ TUBE FLIPTOP (MISCELLANEOUS) ×2 IMPLANT
SUT PDS AB 1 CT1 27 (SUTURE) ×3 IMPLANT
SUT VIC AB 0 CT1 18XCR BRD 8 (SUTURE) IMPLANT
SUT VIC AB 0 CT1 36 (SUTURE) ×2 IMPLANT
SUT VIC AB 0 CT1 8-18 (SUTURE) ×2
SYR BULB IRRIG 60ML STRL (SYRINGE) ×2 IMPLANT
TOWEL OR 17X26 10 PK STRL BLUE (TOWEL DISPOSABLE) ×2 IMPLANT
TOWEL OR NON WOVEN STRL DISP B (DISPOSABLE) ×2 IMPLANT
UNDERPAD 30X36 HEAVY ABSORB (UNDERPADS AND DIAPERS) ×4 IMPLANT

## 2021-11-18 NOTE — Addendum Note (Signed)
Addendum  created 11/18/21 1706 by Myna Bright, CRNA  ? Charge Capture section accepted  ?  ?

## 2021-11-18 NOTE — Interval H&P Note (Signed)
History and Physical Interval Note: ? ?11/18/2021 ?6:44 AM ? ?Maria Davidson  has presented today for surgery, with the diagnosis of vaginal cuff tear.  The various methods of treatment have been discussed with the patient and family. After consideration of risks, benefits and other options for treatment, the patient has consented to  Procedure(s): ?repair of vaginal cuff (N/A) as a surgical intervention.  The patient's history has been reviewed, patient examined, no change in status, stable for surgery.  I have reviewed the patient's chart and labs.  Questions were answered to the patient's satisfaction.   ? ? ?Lafonda Mosses ? ? ?

## 2021-11-18 NOTE — Discharge Instructions (Addendum)
11/18/2021 ? ?Return to work: 4-6 weeks if applicable, You can return to work as long as you keep the listed restrictions below. If you have to do heavy lifting, etc, we advise taking time off from work. ? ?You will need to put a finger tip size of estrogen vaginal cream on the tip of your finger and place just past the entrance of the vagina at bedtime (right before going to bed when you lay down so the cream will not leak out) starting Monday, April 17 everyday for the next 2 weeks. After 2 weeks, then you can decrease use to 3 times a week until we see you in the office. Do not insert far in the vagina, just past the entrance. ? ?Activity: ?1. Be up and out of the bed during the day.  Take a nap if needed.  You may walk up steps but be careful and use the hand rail.  Stair climbing will tire you more than you think, you may need to stop part way and rest.  ? ?2. No lifting or straining over 10 lbs, pushing, pulling, straining for 6 weeks starting now. ? ?3. No driving for minimum 24 hours after procedure.  Do not drive if you are taking narcotic pain medicine. You need to make sure your reaction time has returned and you can brake safely. ? ?4. Shower daily.  Use your regular soap to bathe and when finished pat your incisions dry; don't rub.  No tub baths until cleared by your surgeon.  ? ?5. No sexual activity and nothing in the vagina for 8 weeks minimum starting now. ? ?6. You may experience vaginal spotting after surgery or around the 6-8 week mark from surgery when the stitches at the top of the vagina begin to dissolve.  The spotting is normal but if you experience heavy bleeding, call our office. ? ?7. Take Tylenol or ibuprofen first for pain and only use narcotic pain medication for severe pain not relieved by the Tylenol or Ibuprofen.  Monitor your Tylenol intake to a max of 4,000 mg.  ? ?Diet: ?1. Low sodium Heart Healthy Diet is recommended. ? ?2. It is safe to use a laxative, such as Miralax or Colace,  if you have difficulty moving your bowels. You can take Sennakot at bedtime every evening to keep bowel movements regular and to prevent constipation. YOU WILL NEED TO TAKE SOMETHING EVERYDAY TO PREVENT CONSTIPATION BECAUSE YOU [place].  ? ?Wound Care: ?1. Keep clean and dry.  Shower daily. ? ?Reasons to call the Doctor: ?Fever - Oral temperature greater than 100.4 degrees Fahrenheit ?Foul-smelling vaginal discharge ?Difficulty urinating ?Nausea and vomiting ?Increased pain at the site of the incision that is unrelieved with pain medicine. ?Difficulty breathing with or without chest pain ?New calf pain especially if only on one side ?Sudden, continuing increased vaginal bleeding with or without clots. ?  ?Contacts: ?For questions or concerns you should contact: ? ?Dr. Jeral Pinch at [phone] ? ?Joylene John, NP at [phone] ? ?After Hours: call [phone] and have the GYN Oncologist paged/contacted ?  ?

## 2021-11-18 NOTE — Anesthesia Procedure Notes (Signed)
Procedure Name: LMA Insertion ?Date/Time: 11/18/2021 7:44 AM ?Performed by: Myna Bright, CRNA ?Pre-anesthesia Checklist: Emergency Drugs available, Suction available, Patient identified and Patient being monitored ?Patient Re-evaluated:Patient Re-evaluated prior to induction ?Oxygen Delivery Method: Circle system utilized ?Preoxygenation: Pre-oxygenation with 100% oxygen ?Induction Type: IV induction ?Ventilation: Mask ventilation without difficulty ?LMA: LMA with gastric port inserted ?LMA Size: 4.0 ?Number of attempts: 1 ?Placement Confirmation: positive ETCO2 and breath sounds checked- equal and bilateral ?Tube secured with: Tape ?Dental Injury: Teeth and Oropharynx as per pre-operative assessment  ? ? ? ? ?

## 2021-11-18 NOTE — Telephone Encounter (Signed)
Following up with Paraskevi  after her procedure this morning. Patient reports " I feel ok, I don't have any pain and haven't needed to take anything since I've been home. I did take some miralax just in case. I'm getting up about every hour or so to pee and my urine smells like hot dogs but no burning. I do have a little nausea but no vomiting. I've been able to eat crackers and ginger ale and keep it down." NP notified. ? ?Encouraged patient to drink plenty of water and eat a lighter diet today. Urinary odor could be from the prep used today. Advised patient to monitor symptoms. If nausea and urinary symptoms persist please call the office. Patient verbalizes understanding and has office number as well as after hours numbers and instructed to call with any needs.  ?

## 2021-11-18 NOTE — Op Note (Signed)
OPERATIVE NOTE ?  ?PATIENT: Maria Davidson ?  ?ENCOUNTER DATE: 10/27/21 ?  ?Preop Diagnosis: Vaginal cuff dehiscence ?  ?Postoperative Diagnosis: same as above ?  ?Surgery: EUA, vaginal cuff repair ?  ?Surgeons:  Valarie Cones, MD ?  ?Assistant: Joylene John, NP ?  ?Anesthesia: General  ?  ?Estimated blood loss: 50 ml ?  ?IVF:  see I&O flowsheet  ?  ?Urine output: 50 cc ?  ?Complications: None apparent ?  ?Pathology: none ?  ?Operative findings: Vaginal cuff open with fibrinous tissue noted and closed peritoneum (no bowel seen). Oozing tissue along anterior and posterior vaginal cuff. Rectal exam normal, no stitches appreciated on repeat exam at termination of the procedure. ?  ?Procedure: The patient was identified in the preoperative holding area. Informed consent was signed on the chart. Patient was seen history was reviewed and exam was performed.  ?  ?The patient was then taken to the operating room and placed in the supine position with SCD hose on. General anesthesia was then induced without difficulty. She was then placed in the dorsolithotomy position. The perineum was prepped with Betadine. The vagina was gently prepped with Betadine. The patient was then draped after the prep was dried. Given spontaneous urination during prep, urethra was cleansed with betadine and the bladder drained with a red robbin catheter.  ?  ?Timeout was performed the patient, procedure, antibiotic, allergy, and length of procedure.  ?  ?The weighted speculum was placed in the posterior vagina. Deaver was place in the anterior vagina and the cuff was well visualized. Figure of eight stitches were placed with 0 Vicryl at each apex. Three figure of eight stitches were placed along the mid portion of the cuff with 0 PDS.  All sutures were tied down with good hemostasis and cuff closure noted.  ?  ?The vagina was irrigated and swabbed again with betadine. ? ?Vaginal estrogen was placed. ?  ?Normal vaginal and rectal exam noted at the  completion of procedure. ?  ?All instrument, suture, laparotomy, Ray-Tec, and needle counts were correct x2. The patient tolerated the procedure well and was taken recovery room in stable condition.  ?  ?Lafonda Mosses, MD ?

## 2021-11-18 NOTE — Anesthesia Postprocedure Evaluation (Signed)
Anesthesia Post Note ? ?Patient: Shalayne Leach ? ?Procedure(s) Performed: VAGINAL CUFF REVISION ? ?  ? ?Patient location during evaluation: PACU ?Anesthesia Type: General ?Level of consciousness: awake and alert ?Pain management: pain level controlled ?Vital Signs Assessment: post-procedure vital signs reviewed and stable ?Respiratory status: spontaneous breathing, nonlabored ventilation, respiratory function stable and patient connected to nasal cannula oxygen ?Cardiovascular status: blood pressure returned to baseline and stable ?Postop Assessment: no apparent nausea or vomiting ?Anesthetic complications: no ? ? ?No notable events documented. ? ?Last Vitals:  ?Vitals:  ? 11/18/21 0915 11/18/21 0930  ?BP: 140/86 131/71  ?Pulse: 75 75  ?Resp: 12 15  ?Temp:    ?SpO2: 100% 95%  ?  ?Last Pain:  ?Vitals:  ? 11/18/21 0930  ?TempSrc:   ?PainSc: 0-No pain  ? ? ?  ?  ?  ?  ?  ?  ? ?Esmeralda Blanford S ? ? ? ? ?

## 2021-11-18 NOTE — Transfer of Care (Signed)
Immediate Anesthesia Transfer of Care Note ? ?Patient: Maria Davidson ? ?Procedure(s) Performed: VAGINAL CUFF REVISION ? ?Patient Location: PACU ? ?Anesthesia Type:General ? ?Level of Consciousness: awake, alert , oriented and patient cooperative ? ?Airway & Oxygen Therapy: Patient Spontanous Breathing and Patient connected to face mask oxygen ? ?Post-op Assessment: Report given to RN, Post -op Vital signs reviewed and stable and Patient moving all extremities ? ?Post vital signs: Reviewed and stable ? ?Last Vitals:  ?Vitals Value Taken Time  ?BP 126/76 11/18/21 0856  ?Temp    ?Pulse 78 11/18/21 0857  ?Resp 15 11/18/21 0857  ?SpO2 100 % 11/18/21 0857  ?Vitals shown include unvalidated device data. ? ?Last Pain:  ?Vitals:  ? 11/18/21 0618  ?TempSrc: Oral  ?   ? ?  ? ?Complications: No notable events documented. ?

## 2021-11-18 NOTE — Anesthesia Preprocedure Evaluation (Signed)
Anesthesia Evaluation  ?Patient identified by MRN, date of birth, ID band ?Patient awake ? ? ? ?Reviewed: ?Allergy & Precautions, NPO status , Patient's Chart, lab work & pertinent test results ? ?Airway ?Mallampati: II ? ?TM Distance: >3 FB ?Neck ROM: Full ? ? ? Dental ?no notable dental hx. ? ?  ?Pulmonary ?neg pulmonary ROS,  ?  ?Pulmonary exam normal ?breath sounds clear to auscultation ? ? ? ? ? ? Cardiovascular ?negative cardio ROS ?Normal cardiovascular exam ?Rhythm:Regular Rate:Normal ? ? ?  ?Neuro/Psych ?negative neurological ROS ? negative psych ROS  ? GI/Hepatic ?negative GI ROS, Neg liver ROS,   ?Endo/Other  ?Morbid obesity ? Renal/GU ?negative Renal ROS  ?negative genitourinary ?  ?Musculoskeletal ?negative musculoskeletal ROS ?(+)  ? Abdominal ?  ?Peds ?negative pediatric ROS ?(+)  Hematology ?negative hematology ROS ?(+)   ?Anesthesia Other Findings ? ? Reproductive/Obstetrics ?negative OB ROS ? ?  ? ? ? ? ? ? ? ? ? ? ? ? ? ?  ?  ? ? ? ? ? ? ? ? ?Anesthesia Physical ?Anesthesia Plan ? ?ASA: 2 ? ?Anesthesia Plan: General  ? ?Post-op Pain Management: Minimal or no pain anticipated  ? ?Induction: Intravenous ? ?PONV Risk Score and Plan: 3 and Ondansetron, Dexamethasone and Treatment may vary due to age or medical condition ? ?Airway Management Planned: LMA ? ?Additional Equipment:  ? ?Intra-op Plan:  ? ?Post-operative Plan: Extubation in OR ? ?Informed Consent: I have reviewed the patients History and Physical, chart, labs and discussed the procedure including the risks, benefits and alternatives for the proposed anesthesia with the patient or authorized representative who has indicated his/her understanding and acceptance.  ? ? ? ?Dental advisory given ? ?Plan Discussed with: CRNA and Surgeon ? ?Anesthesia Plan Comments:   ? ? ? ? ? ? ?Anesthesia Quick Evaluation ? ?

## 2021-11-21 ENCOUNTER — Telehealth: Payer: Self-pay | Admitting: *Deleted

## 2021-11-21 ENCOUNTER — Encounter: Payer: Self-pay | Admitting: Gynecologic Oncology

## 2021-11-21 LAB — SURGICAL PATHOLOGY

## 2021-11-21 NOTE — Telephone Encounter (Signed)
Spoke with pt this morning who stated she feels good today but she is still feeling like she is having leaking from her vaginal area as before. She stated she thought the surgical repair was supposed to fix it. Pt unable to describe the leaking amount or color. Pt stated that she doesn't know the amount or if it's leaking every time she feels the sensation because she has a pad on and it mixes in with the spot bleeding. Asked pt if it's the same amount as before the surgery and she stated she doesn't know but she continues to have the sensation of leaking like before the surgery. She stated she's not having any pain but she is having abdominal cramping, gas, and bowel movements that are not solids pieces but broken up into multiple little pieces which is different for her. Pt stated she's not taking anything for pain or stool softners or miralax. She is eating and drinking well and walking because she's back at work. Educated on continuing to do light activities. Pt stated she has only been drinking apple juices and water.  Educated pt to use her vaginal cream nightly for 2 weeks then afterwards use 3 times weekly. Pt stated understanding and stated she's to start the cream tonight. Dr.Tucker made aware.  ?

## 2021-11-21 NOTE — Telephone Encounter (Signed)
Spoke with pt over the phone today to inform her that per Dr.Tucker the picture she sent on mychart looks good and unchanged from when Dr.Tucker saw it at her last visit. She can apply neosporin once or twice a day to it. Pt complained of cramping in her abdomen and feeling like she's still leaking but unable to tell the amount of leaking and the pain is tolerable. It has not affected her bowel and bladder. Per Joylene John, NP pt continue to monitor pain and leaking. If the pain gets worst like a 10/10 and leaking increases or gushes out that pt needs to call us. She can also take tylenol or ibuprofen for the pain. She endorses intermittent nauseas but no vomiting. She is having regular bowel movements without the assistant of bowel medications and she is urinating normally. She is eating and drinking good and denies fever or chills. Pt verbalized understanding.  ?

## 2021-11-21 NOTE — Telephone Encounter (Signed)
Pt called in today and was wondering when someone was going to call her back. Informed her that we will get in touch with her after clinic today. She also stated that the incision on her left upper abdomen is very red around it. She stated no drainage comes out of it only when water runs over it in the shower. It doesn't have any odor and is only just red. Asked pt to send a picture of it on mychart. She stated she will when she gets home. She denies fever or chills. Dr.Tucker informed. ?

## 2021-11-25 ENCOUNTER — Encounter: Payer: Self-pay | Admitting: Gynecologic Oncology

## 2021-11-25 ENCOUNTER — Inpatient Hospital Stay (HOSPITAL_BASED_OUTPATIENT_CLINIC_OR_DEPARTMENT_OTHER): Payer: Commercial Managed Care - PPO | Admitting: Gynecologic Oncology

## 2021-11-25 DIAGNOSIS — T8131XD Disruption of external operation (surgical) wound, not elsewhere classified, subsequent encounter: Secondary | ICD-10-CM

## 2021-11-25 DIAGNOSIS — Z7189 Other specified counseling: Secondary | ICD-10-CM

## 2021-11-25 DIAGNOSIS — Z9889 Other specified postprocedural states: Secondary | ICD-10-CM

## 2021-11-25 DIAGNOSIS — C539 Malignant neoplasm of cervix uteri, unspecified: Secondary | ICD-10-CM

## 2021-11-25 NOTE — Progress Notes (Signed)
Gynecologic Oncology Telehealth Consult Note: Gyn-Onc ? ?I connected with Maria Davidson on 11/25/21 at  4:15 PM EDT by telephone and verified that I am speaking with the correct person using two identifiers. ? ?I discussed the limitations, risks, security and privacy concerns of performing an evaluation and management service by telemedicine and the availability of in-person appointments. I also discussed with the patient that there may be a patient responsible charge related to this service. The patient expressed understanding and agreed to proceed. ? ?Other persons participating in the visit and their role in the encounter: none. ? ?Patient's location: home ?Provider's location: [facility] ? ?Reason for Visit: Follow-up after recent surgery ? ?Treatment History: ?Oncology History Overview Note  ?PD-L1 CPS 60% ?  ?Cervical cancer ([place])  ?01/05/2021 Initial Diagnosis  ? In June, she had a period for 3-6 days with significant back pain and pelvic cramping.  This was more bleeding than she had normally had  ?  ?06/07/2021 Pathology Results  ? [id number] Cervical biopsy showed adenocarcinoma  ?  ?06/09/2021 Procedure  ? Patient underwent colposcopy on 11/3.  Findings at that time was a fleshy, friable mass occupying most of the cervix measuring 3 x 3 cm with polypoid protrusions.  Cervical os unable to be identified.  Biopsies were taken that revealed at least adenocarcinoma in situ. ?  ?06/15/2021 Initial Diagnosis  ? Cervical cancer ([place]) ?  ?06/24/2021 PET scan  ? 1. Ill-defined soft tissue and hypermetabolic activity at the expected area of the cervix, compatible with primary malignancy. ?2. No evidence of FDG avid metastatic disease in the chest, abdomen or pelvis. ?3. Hypermetabolic activity of the left ovary, likely physiologic. ?  ?07/01/2021 Cancer Staging  ? Staging form: Cervix Uteri, AJCC Version 9 ?- Clinical stage from 07/01/2021: FIGO Stage IB3 (cT1b3, cN0, cM0) - Signed by Heath Lark, MD on 07/01/2021 ?Stage  prefix: Initial diagnosis ? ?  ?07/14/2021 - 08/19/2021 Radiation Therapy  ? IMRT ?Declined HDR ?  ?10/04/2021 Surgery  ? Modified radical robotic-assisted laparoscopic total hysterectomy with bilateral salpingectomy, left oophorectomy ? ?Findings: On EUA, cervix 3-4cm, somewhat flush with the left vaginal sidewall, not firm or nodular, no parametrial involvement. On speculum exam, small, <1 cm discoloration around the cervical os that appears c/w tumor vs treated tumor. On intra-abdominal entry, normal upper abdominal survey. Normal omentum, small and large bowel. Somewhat bulbous 8 cm uterus. Right ovary surgically absent. Bilateral tubes normal appearing with some evidence of possibly endosalpingiosis on the left tubes. Normal left ovary. Some mild retroperitoneal fibrosis and edema consistent recent radiation. Some adhesions between the bladder and LUS/Cervix. No obvious extra-cervical evidence of disease.  ?Given concern for close margin to the cervix on the left and posterior aspect, additional left and posterior vaginal margin taken. ?On cystoscopy, bladder including dome intact, good efflux noted from bilateral ureteral orifices.  ?  ?10/04/2021 Pathology Results  ? A. 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 ?B. VAGINAL MARGIN, LEFT, EXCISION:  ?- Negative for carcinoma ? ?  No gross cervical lesion noted on examination of the tissue. ? ?A.   The closest cervical deep margin is 0.3 cm from carcinoma. ?FINAL  MICROSCOPIC DIAGNOSIS:  ? ?A. 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 ?B. VAGINAL MARGIN, LEFT, EXCISION:  ?- Negative for carcinoma  ? ? ? ? ? ?COMMENT:  ? ?A.  Dr. Saralyn Pilar reviewed the case and concurs with the diagnosis.  A  ?gross lesion was not identified in the cervix, but the tumor appears to  ?be less than 2 cm in greatest linear extent based on microscopic  ?examination.  ? ? ? ?ONCOLOGY TABLE:  ? ?UTERINE CERVIX, CARCINOMA: Resection  ? ?Procedure: Total hysterectomy and bilateral salpingo-oophorectomy  ?Tumor Size: Cannot be determined  ?Histologic Type: Adenocarcinoma  ?Histologic Grade: High grade  ?Stromal Invasion: Present  ?     Depth of stromal invasion (mm): 7 mm  ?Other Tissue/ Organ: Not applicable  ?Margins: All margins negative for invasive carcinoma  ?Margin Status for HSIL or AIS: All margins negative for HSIL or AIS  ?Lymphovascular invasion: Not identified  ?     Regional Lymph Nodes: Not applicable (no lymph nodes submitted or  ?found)  ?Distant sites involved: Not applicable  ?Pathologic Stage Classification (pTNM, AJCC 8th Edition): pT1b1, pN not  ?assigned  ?Ancillary Studies: Can be performed upon request  ?Representative Tumor Block: A6  ?  ? ? ?Interval History: ?Has some fatigue. ?Up until Monday afternoon had some nausea.  This is overall improved although if she does not eat much in the morning, will sometimes have a little nausea.  Does not feel like she is going to have  emesis. ?Tolerating liquids and solids.  ?Denies pain. ?Abdominal cramping significantly better. Using Mirilax prn. If goes multiple days without bowel function, will feel bloating and some cramping.  ?On pad, brown discharge or light spotting daily. No odor.   ?When wipes nothing or very light spotting. Doesn't have to change pad very frequently. ?Doesn't feel like leaking, just some moisture.  ? ?Past Medical/Surgical History: ?Past Medical History:  ?Diagnosis Date  ? Cervical cancer ([employer])   ? History of radiation therapy   ? Cervix 07/14/21-08/19/21-Dr. Gery Pray  ? Obesity, Class III, BMI 40-49.9 (morbid obesity) ([place])   ? Ovarian tumor (benign) 12/06/1999  ? ? ?Past Surgical History:  ?Procedure Laterality Date  ? CYSTOSCOPY N/A 10/04/2021  ? Procedure: CYSTOSCOPY;  Surgeon: Lafonda Mosses, MD;  Location: [facility];  Service: Gynecology;  Laterality: N/A;  ? LAPAROSCOPIC UNILATERAL SALPINGO OOPHERECTOMY    ? right side - lap  ? ROBOTIC ASSISTED BILATERAL SALPINGO OOPHERECTOMY N/A 10/04/2021  ? Procedure: XI ROBOTIC ASSISTED LEFT OOPHORECTOMY;  Surgeon: Lafonda Mosses, MD;  Location: [facility];  Service: Gynecology;  Laterality: N/A;  ? ROBOTIC ASSISTED LAPAROSCOPIC HYSTERECTOMY AND SALPINGECTOMY N/A 10/04/2021  ? Procedure: XI ROBOTIC ASSISTED LAPAROSCOPIC MODIFIED RADICAL HYSTERECTOMY AND BILATERAL SALPINGECTOMY;  Surgeon: Lafonda Mosses, MD;  Location: [facility];  Service: Gynecology;  Laterality: N/A;  ? ? ?  Family History  ?Problem Relation Age of Onset  ? Cancer Mother 77  ?     uterine cancer  ? Colon cancer Neg Hx   ? Breast cancer Neg Hx   ? Ovarian cancer Neg Hx   ? Endometrial cancer Neg Hx   ? Pancreatic cancer Neg Hx   ? Prostate cancer Neg Hx   ? ? ?Social History  ? ?Socioeconomic History  ? Marital status: Divorced  ?  Spouse name: Not on file  ? Number of children: 2  ? Years of education: Not on file  ? Highest education level: Not on file  ?Occupational History  ? Occupation: [employer]   ?Tobacco Use  ? Smoking status: Never  ? Smokeless tobacco: Never  ?Vaping Use  ? Vaping Use: Never used  ?Substance and Sexual Activity  ? Alcohol use: Not Currently  ? Drug use: No  ? Sexual activity: Yes  ?

## 2021-11-30 ENCOUNTER — Encounter: Payer: Self-pay | Admitting: Gynecologic Oncology

## 2021-12-08 ENCOUNTER — Other Ambulatory Visit: Payer: Self-pay

## 2021-12-08 ENCOUNTER — Inpatient Hospital Stay: Payer: Commercial Managed Care - PPO | Attending: Gynecologic Oncology | Admitting: Gynecologic Oncology

## 2021-12-08 ENCOUNTER — Encounter: Payer: Self-pay | Admitting: Gynecologic Oncology

## 2021-12-08 VITALS — BP 134/73 | HR 83 | Temp 98.0°F | Resp 16 | Ht 67.0 in | Wt 291.6 lb

## 2021-12-08 DIAGNOSIS — Z90710 Acquired absence of both cervix and uterus: Secondary | ICD-10-CM

## 2021-12-08 DIAGNOSIS — Z7189 Other specified counseling: Secondary | ICD-10-CM

## 2021-12-08 DIAGNOSIS — C539 Malignant neoplasm of cervix uteri, unspecified: Secondary | ICD-10-CM

## 2021-12-08 DIAGNOSIS — Z90722 Acquired absence of ovaries, bilateral: Secondary | ICD-10-CM

## 2021-12-08 NOTE — Patient Instructions (Signed)
It was good to see you today.  Things look like they are healing well.  Please remember, no heavy lifting and pelvic rest for the time being.  I will see you in June.  Please call if you have any issues before then ?

## 2021-12-08 NOTE — Progress Notes (Signed)
Gynecologic Oncology Return Clinic Visit ? ?12/08/21 ? ?Reason for Visit: post-operative follow-up after vaginal cuff dehiscence ? ?Treatment History: ?Oncology History Overview Note  ?PD-L1 CPS 60% ?  ?Cervical cancer ([place])  ?01/05/2021 Initial Diagnosis  ? In June, she had a period for 3-6 days with significant back pain and pelvic cramping.  This was more bleeding than she had normally had  ?  ?06/07/2021 Pathology Results  ? [id number] Cervical biopsy showed adenocarcinoma  ?  ?06/09/2021 Procedure  ? Patient underwent colposcopy on 11/3.  Findings at that time was a fleshy, friable mass occupying most of the cervix measuring 3 x 3 cm with polypoid protrusions.  Cervical os unable to be identified.  Biopsies were taken that revealed at least adenocarcinoma in situ. ?  ?06/15/2021 Initial Diagnosis  ? Cervical cancer ([place]) ?  ?06/24/2021 PET scan  ? 1. Ill-defined soft tissue and hypermetabolic activity at the expected area of the cervix, compatible with primary malignancy. ?2. No evidence of FDG avid metastatic disease in the chest, abdomen or pelvis. ?3. Hypermetabolic activity of the left ovary, likely physiologic. ?  ?07/01/2021 Cancer Staging  ? Staging form: Cervix Uteri, AJCC Version 9 ?- Clinical stage from 07/01/2021: FIGO Stage IB3 (cT1b3, cN0, cM0) - Signed by Heath Lark, MD on 07/01/2021 ?Stage prefix: Initial diagnosis ? ?  ?07/14/2021 - 08/19/2021 Radiation Therapy  ? IMRT ?Declined HDR ?  ?10/04/2021 Surgery  ? Modified radical robotic-assisted laparoscopic total hysterectomy with bilateral salpingectomy, left oophorectomy ? ?Findings: On EUA, cervix 3-4cm, somewhat flush with the left vaginal sidewall, not firm or nodular, no parametrial involvement. On speculum exam, small, <1 cm discoloration around the cervical os that appears c/w tumor vs treated tumor. On intra-abdominal entry, normal upper abdominal survey. Normal omentum, small and large bowel. Somewhat bulbous 8 cm uterus. Right ovary surgically  absent. Bilateral tubes normal appearing with some evidence of possibly endosalpingiosis on the left tubes. Normal left ovary. Some mild retroperitoneal fibrosis and edema consistent recent radiation. Some adhesions between the bladder and LUS/Cervix. No obvious extra-cervical evidence of disease.  ?Given concern for close margin to the cervix on the left and posterior aspect, additional left and posterior vaginal margin taken. ?On cystoscopy, bladder including dome intact, good efflux noted from bilateral ureteral orifices.  ?  ?10/04/2021 Pathology Results  ? A. 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 ?B. VAGINAL MARGIN, LEFT, EXCISION:  ?- Negative for carcinoma ? ?No gross cervical lesion noted on examination of the tissue. ? ?A.   The closest cervical deep margin is 0.3 cm from carcinoma. ?FINAL MICROSCOPIC DIAGNOSIS:  ? ?A. UTERUS, CERIVX, LEFT OVARY, BILATERAL FALLOPIAN TUBES:  ?- Invasive adenocarcinoma, high nuclear-grade, involving all quadrants  ?of the cervix  ?- Carcinoma invades for a maximum depth of 0.7 cm  ?- Cervical margins are negative for carcinoma  ?- Carcinoma involves anterior and posterior lower uterine segments  ?- Rest of endometrium shows atrophic and metaplastic changes, negative  ?for carcinoma  ?- Bilateral fallopian  tubes and ovaries are negative for carcinoma  ?- No evidence of lymphovascular invasion  ?- See oncology table  ?- See comment  ? ?B. VAGINAL MARGIN, LEFT, EXCISION:   ?- Negative for carcinoma  ? ? ? ? ? ?COMMENT:  ? ?A.  Dr. Saralyn Pilar reviewed the case and concurs with the diagnosis.  A  ?gross lesion was not identified in the cervix, but the tumor appears to  ?be less than 2 cm in greatest linear extent based on microscopic  ?examination.  ? ? ? ?ONCOLOGY TABLE:  ? ?UTERINE CERVIX, CARCINOMA: Resection  ? ?Procedure: Total hysterectomy and bilateral salpingo-oophorectomy  ?Tumor Size: Cannot be determined  ?Histologic Type: Adenocarcinoma  ?Histologic Grade: High grade  ?Stromal Invasion: Present  ?     Depth of stromal invasion (mm): 7 mm  ?Other Tissue/ Organ: Not applicable  ?Margins: All margins negative for invasive carcinoma  ?Margin Status for HSIL or AIS: All margins negative for HSIL or AIS  ?Lymphovascular invasion: Not identified  ?     Regional Lymph Nodes: Not applicable (no lymph nodes submitted or  ?found)  ?Distant sites involved: Not applicable  ?Pathologic Stage Classification (pTNM, AJCC 8th Edition): pT1b1, pN not  ?assigned  ?Ancillary Studies: Can be performed upon request  ?Representative Tumor Block: A6  ?  ? ? ?Interval History: ?Patient reports overall doing well.  She endorses very minimal brown spotting, denies any bright red bleeding.  Urinary urgency has resolved completely.  Still occasionally has leaking sensation but when she looks, there is only some moistness noted on her underwear, no leaking fluid.  Endorses regular bowel function.  If she has not had a bowel movement in a couple of days, she will have right upper quadrant discomfort that resolves after bowel function. ? ?She endorses a good appetite without nausea or emesis.  Denies any fevers or chills. ? ?Past Medical/Surgical History: ?Past Medical History:  ?Diagnosis Date  ? Cervical cancer ([place])   ? History of radiation therapy   ? Cervix 07/14/21-08/19/21-Dr. Gery Pray  ? Obesity, Class III, BMI 40-49.9 (morbid obesity) ([place])   ? Ovarian tumor (benign) 12/06/1999  ? ? ?Past  Surgical History:  ?Procedure Laterality Date  ? CYSTOSCOPY N/A 10/04/2021  ? Procedure: CYSTOSCOPY;  Surgeon: Lafonda Mosses, MD;  Location: [facility];  Service: Gynecology;  Laterality: N/A;  ? LAPAROSCOPIC UNILATERAL SALPINGO OOPHERECTOMY    ? right side - lap  ? ROBOTIC ASSISTED BILATERAL SALPINGO OOPHERECTOMY N/A 10/04/2021  ? Procedure: XI ROBOTIC ASSISTED LEFT OOPHORECTOMY;  Surgeon: Lafonda Mosses, MD;  [facility];  Service: Gynecology;  Laterality: N/A;  ? ROBOTIC ASSISTED LAPAROSCOPIC HYSTERECTOMY AND SALPINGECTOMY N/A 10/04/2021  ? Procedure: XI ROBOTIC ASSISTED LAPAROSCOPIC MODIFIED RADICAL HYSTERECTOMY AND BILATERAL SALPINGECTOMY;  Surgeon: Lafonda Mosses, MD;  Location: [facility];  Service: Gynecology;  Laterality: N/A;  ? ? ?Family History  ?Problem Relation Age of Onset  ? Cancer Mother 14  ?     uterine cancer  ? Colon cancer Neg Hx   ? Breast cancer Neg Hx   ? Ovarian cancer Neg Hx   ? Endometrial cancer Neg Hx   ? Pancreatic cancer Neg Hx   ? Prostate cancer Neg Hx   ? ? ?Social History  ? ?Socioeconomic History  ? Marital status: Divorced  ?  Spouse name: Not on file  ? Number of children: 2  ? Years of education: Not on file  ? Highest education level: Not on file  ?  Occupational History  ? Occupation: [employer]  ?Tobacco Use  ? Smoking status: Never  ? Smokeless tobacco: Never  ?Vaping Use  ? Vaping Use: Never used  ?Substance and Sexual Activity  ? Alcohol use: Not Currently  ? Drug use: No  ? Sexual activity: Yes  ?  Birth control/protection: None  ?Other Topics Concern  ? Not on file  ?Social History Narrative  ? Not on file  ? ?Social Determinants of Health  ? ?Financial Resource Strain: Not on file  ?Food Insecurity: Not on file  ?Transportation Needs: Not on file  ?Physical Activity: Not on file  ?Stress: Not on file  ?Social Connections: Not on file  ? ? ?Current Medications: ? ?Current Outpatient Medications:  ?  estradiol (CLIMARA) 0.05 mg/24hr patch, Place 1 patch (0.05 mg  total) onto the skin once a week. Start 1 week postop (Patient not taking: Reported on 11/18/2021), Disp: 12 patch, Rfl: 3 ?  estradiol (ESTRACE VAGINAL) 0.1 MG/GM vaginal cream, Apply finger tip size amount of cr

## 2021-12-16 ENCOUNTER — Telehealth: Payer: Self-pay | Admitting: *Deleted

## 2021-12-16 NOTE — Telephone Encounter (Signed)
Pt called in this morning and stated that yesterday she's noticing intermittent feeling of leakage again. She's concern because of what happened last time when she had the same leakage feeling. She stated it's just a small amount with some spotting which she knows is normal. She denies any odor or pain associated with it. Informed pt this is part of the healing process and it's normal to see some these symptoms. But if the leaking or bleeding increases to contact the office or call the after hours number. Pt verbalized understanding.  ?Melissa NP notified.  ?

## 2021-12-21 ENCOUNTER — Telehealth: Payer: Self-pay

## 2021-12-21 NOTE — Telephone Encounter (Signed)
Per Joylene John NP, pt is 5 weeks post-op. The type of suture that was used in the cuff, can take awhile to completely dissolve. What she is experiencing is normal. Pt aware of advice and to keep her appointment with Dr. Berline Lopes on 6/1. She verbalized an understanding.  ?

## 2021-12-21 NOTE — Telephone Encounter (Signed)
Pt called office today stating she has concerns of spotting (not bright red), more discharge than anything and intermittant abdominal cramping. She states she wears a pad and has to change several times a day. She will get a vaginal sensation as if leaking and when checks pad sometimes there is drainage on the pad and sometimes not. States she will have abdominal cramping which she realizes could be diet related. No odor or pain with drainage. Pt states she thinks she should not be having any effects from surgery this far out. ?Pt has post-op appointment with Dr.Tucker on 6/1. She has asked if she could get a call back today. Aware Berline Lopes is out of the office.  ?

## 2021-12-29 ENCOUNTER — Encounter: Payer: Self-pay | Admitting: Gynecologic Oncology

## 2022-01-05 ENCOUNTER — Other Ambulatory Visit: Payer: Self-pay

## 2022-01-05 ENCOUNTER — Inpatient Hospital Stay: Payer: Commercial Managed Care - PPO | Attending: Gynecologic Oncology | Admitting: Gynecologic Oncology

## 2022-01-05 ENCOUNTER — Encounter: Payer: Self-pay | Admitting: Gynecologic Oncology

## 2022-01-05 VITALS — BP 149/91 | HR 79 | Temp 97.6°F | Resp 16 | Ht 67.0 in | Wt 286.5 lb

## 2022-01-05 DIAGNOSIS — C539 Malignant neoplasm of cervix uteri, unspecified: Secondary | ICD-10-CM

## 2022-01-05 DIAGNOSIS — Z90721 Acquired absence of ovaries, unilateral: Secondary | ICD-10-CM

## 2022-01-05 DIAGNOSIS — Z9079 Acquired absence of other genital organ(s): Secondary | ICD-10-CM

## 2022-01-05 DIAGNOSIS — Z90710 Acquired absence of both cervix and uterus: Secondary | ICD-10-CM

## 2022-01-05 DIAGNOSIS — Z7189 Other specified counseling: Secondary | ICD-10-CM

## 2022-01-05 NOTE — Patient Instructions (Signed)
It was good to see you today.  You continue to heal well.  It is okay for you to start taking baths and to lift a little bit more.  I recommend pelvic rest for at least another 4 weeks.  We will plan to get a CT scan to establish a baseline now that you have finished your treatment and have healed fully from surgery.  I want to know what the top of the vagina looks like after the surgery because of the issues with wound healing.  This will help if we need to get a scan at any point in the future as comparison.  We will start our regular cancer follow-up at this point.  This will be visits with me every 3 months initially.  If you develop any of the symptoms that I discussed today before your next visit, please call to see me sooner.

## 2022-01-05 NOTE — Progress Notes (Signed)
Gynecologic Oncology Return Clinic Visit  01/05/22  Reason for Visit: first surveillance visit in the setting of cervical cancer, post-op  Treatment History: Oncology History Overview Note  PD-L1 CPS 60%   Cervical cancer ([place])  01/05/2021 Initial Diagnosis   In June, she had a period for 3-6 days with significant back pain and pelvic cramping.  This was more bleeding than she had normally had    06/07/2021 Pathology Results   [id number] Cervical biopsy showed adenocarcinoma    06/09/2021 Procedure   Patient underwent colposcopy on 11/3.  Findings at that time was a fleshy, friable mass occupying most of the cervix measuring 3 x 3 cm with polypoid protrusions.  Cervical os unable to be identified.  Biopsies were taken that revealed at least adenocarcinoma in situ.   06/15/2021 Initial Diagnosis   Cervical cancer ([place])   06/24/2021 PET scan   1. Ill-defined soft tissue and hypermetabolic activity at the expected area of the cervix, compatible with primary malignancy. 2. No evidence of FDG avid metastatic disease in the chest, abdomen or pelvis. 3. Hypermetabolic activity of the left ovary, likely physiologic.   07/01/2021 Cancer Staging   Staging form: Cervix Uteri, AJCC Version 9 - Clinical stage from 07/01/2021: FIGO Stage IB3 (cT1b3, cN0, cM0) - Signed by Heath Lark, MD on 07/01/2021 Stage prefix: Initial diagnosis    07/14/2021 - 08/19/2021 Radiation Therapy   IMRT Declined HDR   10/04/2021 Surgery   Modified radical robotic-assisted laparoscopic total hysterectomy with bilateral salpingectomy, left oophorectomy  Findings: On EUA, cervix 3-4cm, somewhat flush with the left vaginal sidewall, not firm or nodular, no parametrial involvement. On speculum exam, small, <1 cm discoloration around the cervical os that appears c/w tumor vs treated tumor. On intra-abdominal entry, normal upper abdominal survey. Normal omentum, small and large bowel. Somewhat bulbous 8 cm uterus. Right ovary  surgically absent. Bilateral tubes normal appearing with some evidence of possibly endosalpingiosis on the left tubes. Normal left ovary. Some mild retroperitoneal fibrosis and edema consistent recent radiation. Some adhesions between the bladder and LUS/Cervix. No obvious extra-cervical evidence of disease.  Given concern for close margin to the cervix on the left and posterior aspect, additional left and posterior vaginal margin taken. On cystoscopy, bladder including dome intact, good efflux noted from bilateral ureteral orifices.    10/04/2021 Pathology Results   A. UTERUS, CERIVX, LEFT OVARY, BILATERAL FALLOPIAN TUBES:  - Invasive adenocarcinoma, high nuclear-grade, involving all quadrants  of the cervix  - Carcinoma invades for a maximum depth of 0.7 cm  - Cervical margins are negative for carcinoma  - Carcinoma involves anterior and posterior lower uterine segments  - Rest of endometrium shows atrophic and metaplastic changes, negative  for carcinoma  - Bilateral fallopian tubes and ovaries are negative for carcinoma  - No evidence of lymphovascular invasion  - See oncology table  - See comment   B. VAGINAL MARGIN, LEFT, EXCISION:  - Negative for carcinoma  No gross cervical lesion noted on examination of the tissue.  A.   The closest cervical deep margin is 0.3 cm from carcinoma. FINAL MICROSCOPIC DIAGNOSIS:   A. UTERUS, CERIVX, LEFT OVARY, BILATERAL FALLOPIAN TUBES:  - Invasive adenocarcinoma, high nuclear-grade, involving all quadrants  of the cervix  - Carcinoma invades for a maximum depth of 0.7 cm  - Cervical margins are negative for carcinoma  - Carcinoma involves anterior and posterior lower uterine segments  - Rest of endometrium shows atrophic and metaplastic changes, negative  for carcinoma  -  Bilateral fallopian tubes and ovaries are negative for carcinoma  - No evidence of lymphovascular invasion  - See oncology table  - See comment   B. VAGINAL MARGIN, LEFT,  EXCISION:  - Negative for carcinoma       COMMENT:   A.  Dr. Saralyn Pilar reviewed the case and concurs with the diagnosis.  A  gross lesion was not identified in the cervix, but the tumor appears to  be less than 2 cm in greatest linear extent based on microscopic  examination.     ONCOLOGY TABLE:   UTERINE CERVIX, CARCINOMA: Resection   Procedure: Total hysterectomy and bilateral salpingo-oophorectomy  Tumor Size: Cannot be determined  Histologic Type: Adenocarcinoma  Histologic Grade: High grade  Stromal Invasion: Present       Depth of stromal invasion (mm): 7 mm  Other Tissue/ Organ: Not applicable  Margins: All margins negative for invasive carcinoma  Margin Status for HSIL or AIS: All margins negative for HSIL or AIS  Lymphovascular invasion: Not identified       Regional Lymph Nodes: Not applicable (no lymph nodes submitted or  found)  Distant sites involved: Not applicable  Pathologic Stage Classification (pTNM, AJCC 8th Edition): pT1b1, pN not  assigned  Ancillary Studies: Can be performed upon request  Representative Tumor Block: A6      Interval History: Patient reports overall doing very well.  She endorses complete cessation of any spotting or discharge for the last week.  She endorses regular bowel function.  Has had improvement in her urinary symptoms.  Feels that she is able to empty her bladder and does not have to void again immediately after she voids.  She denies any pelvic pain.  She had a brief episode of left mid abdominal menstrual-like for ovarian cramping today, this was the first time in months.  Endorses a good appetite without nausea or emesis.  Past Medical/Surgical History: Past Medical History:  Diagnosis Date   Cervical cancer ([place])    History of radiation therapy    Cervix 07/14/21-08/19/21-Dr. Gery Pray   Obesity, Class III, BMI 40-49.9 (morbid obesity) ([place])    Ovarian tumor (benign) 12/06/1999    Past Surgical History:   Procedure Laterality Date   CYSTOSCOPY N/A 10/04/2021   Procedure: CYSTOSCOPY;  Surgeon: Lafonda Mosses, MD;  Location: [facility];  Service: Gynecology;  Laterality: N/A;   LAPAROSCOPIC UNILATERAL SALPINGO OOPHERECTOMY     right side - lap   ROBOTIC ASSISTED BILATERAL SALPINGO OOPHERECTOMY N/A 10/04/2021   Procedure: XI ROBOTIC ASSISTED LEFT OOPHORECTOMY;  Surgeon: Lafonda Mosses, MD;  Location: [facility];  Service: Gynecology;  Laterality: N/A;   ROBOTIC ASSISTED LAPAROSCOPIC HYSTERECTOMY AND SALPINGECTOMY N/A 10/04/2021   Procedure: XI ROBOTIC ASSISTED LAPAROSCOPIC MODIFIED RADICAL HYSTERECTOMY AND BILATERAL SALPINGECTOMY;  Surgeon: Lafonda Mosses, MD;  Location: [facility];  Service: Gynecology;  Laterality: N/A;    Family History  Problem Relation Age of Onset   Cancer Mother 53       uterine cancer   Colon cancer Neg Hx    Breast cancer Neg Hx    Ovarian cancer Neg Hx    Endometrial cancer Neg Hx    Pancreatic cancer Neg Hx    Prostate cancer Neg Hx     Social History   Socioeconomic History   Marital status: Divorced    Spouse name: Not on file   Number of children: 2   Years of education: Not on file   Highest education level: Not  on file  Occupational History   Occupation: [employer]  Tobacco Use   Smoking status: Never   Smokeless tobacco: Never  Vaping Use   Vaping Use: Never used  Substance and Sexual Activity   Alcohol use: Not Currently   Drug use: No   Sexual activity: Yes    Birth control/protection: None  Other Topics Concern   Not on file  Social History Narrative   Not on file   Social Determinants of Health   Financial Resource Strain: Not on file  Food Insecurity: Not on file  Transportation Needs: Not on file  Physical Activity: Not on file  Stress: Not on file  Social Connections: Not on file    Current Medications: No current outpatient medications on file.  Review of Systems: Denies appetite changes, fevers, chills, fatigue,  unexplained weight changes. Denies hearing loss, neck lumps or masses, mouth sores, ringing in ears or voice changes. Denies cough or wheezing.  Denies shortness of breath. Denies chest pain or palpitations. Denies leg swelling. Denies abdominal distention, pain, blood in stools, constipation, diarrhea, nausea, vomiting, or early satiety. Denies pain with intercourse, dysuria, frequency, hematuria or incontinence. Denies hot flashes, pelvic pain, vaginal bleeding or vaginal discharge.   Denies joint pain, back pain or muscle pain/cramps. Denies itching, rash, or wounds. Denies dizziness, headaches, numbness or seizures. Denies swollen lymph nodes or glands, denies easy bruising or bleeding. Denies anxiety, depression, confusion, or decreased concentration.  Physical Exam: BP (!) 149/91 (BP Location: Right Arm, Patient Position: Sitting)   Pulse 79   Temp 97.6 F (36.4 C) (Temporal)   Resp 16   Ht 5' 7"  (1.702 m)   Wt 286 lb 8 oz (130 kg)   LMP 08/21/2021 Comment: preg.test pending 10/04/21  SpO2 100%   BMI 44.87 kg/m  General: Alert, oriented, no acute distress. HEENT: Normocephalic, atraumatic, sclera anicteric. Chest: Unlabored breathing on room air. Abdomen: Obese, soft, nontender.  Normoactive bowel sounds.  No masses or hepatosplenomegaly appreciated.  Well-healed incisions. Extremities: Grossly normal range of motion.  Warm, well perfused.  No edema bilaterally. Skin: No rashes or lesions noted. Lymphatics: No cervical, supraclavicular, or inguinal adenopathy. GU: Normal appearing external genitalia without erythema, excoriation, or lesions.  Speculum exam no blood or discharge within the vault.  Vaginal mucosa is well rugated, no vaginal lesions appreciated.  Cuff is visually intact with PDS suture visible (clear now).  Gentle bimanual exam reveals cuff intact, no fluctuance or tenderness with palpation.  Laboratory & Radiologic Studies: None new  Assessment & Plan: Maria Davidson is a 41 y.o. 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   Patient is overall doing very well and meeting postoperative milestones.  Cuff continues to appear intact on exam.  Discussed releasing her for baths as well as removing lifting restrictions.  Would recommend another 4 weeks at least of pelvic rest.     Given her modified treatment course as well as her postoperative issues with vaginal dehiscence and delayed healing, I am recommending that we get a CT scan of the abdomen and pelvis to establish a posttreatment baseline.  I worry that if she were to begin having symptoms, she very likely has thickening of the vaginal cuff which could be difficult to distinguish between scar tissue and malignancy.  Per NCCN surveillance recommendations, we will plan for surveillance visits every 3 months.  These will be with me only per our prior discussion.  We reviewed signs and symptoms that would be concerning for cancer recurrence, and I stressed the importance of calling if she develops any of these between visits.  28 minutes of total time was spent for this patient encounter, including preparation, face-to-face counseling with the patient and coordination of care, and documentation of the encounter.  Jeral Pinch, MD  Division of Gynecologic Oncology  Department of Obstetrics and Gynecology  [facility]

## 2022-01-06 ENCOUNTER — Telehealth: Payer: Self-pay | Admitting: *Deleted

## 2022-01-06 ENCOUNTER — Telehealth: Payer: Self-pay

## 2022-01-06 NOTE — Telephone Encounter (Signed)
Per patient request moved the CT scan from 6/9 to 6/10. Patient given new date/time and instruction

## 2022-01-06 NOTE — Telephone Encounter (Signed)
Patient called and was given the date/time and instructions for the CT scan

## 2022-01-06 NOTE — Telephone Encounter (Signed)
Lvm for pt to call back regarding her scheduled CT scan. Appointment is 01/13/22 at 5:00pm. Pt to arrive at 4:30. NPO 4 hours prior (1:00pm), contrast first bottle at 3:00pm, second at 4:00pm.

## 2022-01-11 ENCOUNTER — Telehealth: Payer: Self-pay

## 2022-01-11 NOTE — Telephone Encounter (Signed)
Pt called office today stating at her appointment last week she wasn't having any leaking/discharge. Starting Friday 6/2, she has noticed watery leaking, her panties are not saturated but just feels damp. No discharge, no odor, no color, no pain. She is wanting to know if this could still be suture dissolving and the healing process? I reassured it possibly could be. She also has a CT scan on Saturday 6/10  and she is wondering what this is for (what is Dr.Tucker looking for). Pt is aware I will senf message to Joylene John NP, because Dr.Tucker is in the OR today. She voiced an understanding [phone]# [phone]

## 2022-01-11 NOTE — Telephone Encounter (Signed)
Per Joylene John, NP leaking can be normal since having surgery, continue w/at least 4 more weeks of pelvic rest, nothing in vagina. CT scan is to serve as a baseline for future scans, if any needed, since things have changed since having surgery. Pt aware of information and was thankful for the call back.

## 2022-01-12 ENCOUNTER — Telehealth: Payer: Self-pay | Admitting: *Deleted

## 2022-01-12 ENCOUNTER — Telehealth: Payer: Self-pay | Admitting: Gynecologic Oncology

## 2022-01-12 NOTE — Telephone Encounter (Signed)
Pt called this today and stated that she wants to talk to Dr.Tucker about some of her symptoms. She stated that she does not want anyone else to call her back but Dr.Tucker. She understands that we take her messages and call her back after the providers have reviewed them but this time she would like to speak with Dr.Tucker personally to get reassurance. Pt stated that since her last visit with Dr.Tucker the vaginal wetness and leaking sensation has returned. It's not actually a discharge but when she looks at her panty liner its is sometimes wet and damp but she does not see a discharge on there. She denies any pain, odor, spotting, associated with it. Today she reports a new symptom of the sensation of "bubble" in the vagina. It's random and happens when she sitting or standing. She stated it just feels like bubbles. It's like gas bubbles in her vagina and she does not understand why this is happening. She has not been drinking carbonated beverages. She would like to speak with Dr.Tucker about these symptoms and a few more questions she has. Informed her we would let the provider know. She verbalized understanding.

## 2022-01-12 NOTE — Telephone Encounter (Signed)
Called the patient to follow-up on her phone call from earlier today.  She notes overall doing well.  Since her visit with me, starting the day after, she has noted some of the "leaking feeling" again.  She notes that it is different this time that she is not seeing fluid or discharge.  She has a sensation that something is leaking down her vagina but often her underwear or dry or will be very slightly moist.  She denies any odor, discharge, or bleeding.  She denies any pain.  Reports continued normal bowel function.  Today, she describes a sensation of having "bubbles" coming from her vagina.  Denies any odor to this.  Reviewed possible explanation for her symptoms.  Discussed that CT scan will help provide some reassurance.  She is scheduled for this on Saturday.  Also discussed that I would be happy to see her to perform another exam to check the integrity of the vaginal cuff and to rule out any obvious rectovaginal fistula (there has not been one on any of the exams that I have done previously).  She prefers to wait until after the CT scan.  I will plan to call her on Monday and if she is still having any of the above symptoms, we will get her scheduled for an in person follow-up.  Jeral Pinch MD Gynecologic Oncology

## 2022-01-13 ENCOUNTER — Other Ambulatory Visit (HOSPITAL_COMMUNITY): Payer: Commercial Managed Care - PPO

## 2022-01-13 ENCOUNTER — Telehealth: Payer: Self-pay | Admitting: *Deleted

## 2022-01-13 NOTE — Telephone Encounter (Signed)
Patient called and asked "If at my CT tomorrow I only do the IV contrast will that be ok?" Per Dr Berline Lopes explained to the patient "Dr Berline Lopes says it is ultimately your decision, but with the oral contrast we will get better pictures."   Patient asked "so if I only do the IV contrast it will not hurt anything?" Explained that per Dr Berline Lopes "the pictures will be better, but will not hurt if no oral contrast." Patient verbalized understanding

## 2022-01-14 ENCOUNTER — Ambulatory Visit (HOSPITAL_COMMUNITY)
Admission: RE | Admit: 2022-01-14 | Discharge: 2022-01-14 | Disposition: A | Discharge status: Home / Self Care | Payer: Commercial Managed Care - PPO | Source: Ambulatory Visit | Attending: Gynecologic Oncology | Admitting: Gynecologic Oncology

## 2022-01-14 DIAGNOSIS — C539 Malignant neoplasm of cervix uteri, unspecified: Secondary | ICD-10-CM | POA: Insufficient documentation

## 2022-01-14 MED ORDER — IOHEXOL 300 MG/ML  SOLN
100.0000 mL | Freq: Once | INTRAMUSCULAR | Status: AC | PRN
  Administered 2022-01-14: 100 mL via INTRAVENOUS

## 2022-01-16 ENCOUNTER — Telehealth: Payer: Self-pay | Admitting: Gynecologic Oncology

## 2022-01-16 ENCOUNTER — Encounter: Payer: Self-pay | Admitting: Gynecologic Oncology

## 2022-01-16 NOTE — Telephone Encounter (Signed)
Called the patient to discuss recent CT scan.  Reviewed findings.  Spoke with one of our body imaging radiology specialist today given what I felt was more likely to represent postoperative changes along the left pelvic sidewall.  His interpretation was also that this area did not look consistent with an enlarged lymph node but more likely postoperative seroma or postoperative changes.  He recommended follow-up imaging with a CT scan to assure no changes in 3-6 months.  He did not feel that PET scan was warranted given appearance and location of this area.  All patient's questions answered.  Voiced that I am happy at any point to see her in clinic and perform an exam if she feels that vaginal symptoms have changed.  Otherwise, she is scheduled to see me in 3 months.  Jeral Pinch MD Gynecologic Oncology

## 2022-01-20 ENCOUNTER — Ambulatory Visit: Payer: Commercial Managed Care - PPO | Admitting: Gynecologic Oncology

## 2022-01-23 ENCOUNTER — Encounter: Payer: Self-pay | Admitting: Gynecologic Oncology

## 2022-02-08 ENCOUNTER — Encounter: Payer: Self-pay | Admitting: Gynecologic Oncology

## 2022-02-10 ENCOUNTER — Telehealth: Payer: Self-pay | Admitting: *Deleted

## 2022-02-10 NOTE — Telephone Encounter (Signed)
Attempted to reach pt. Unable to reach her. LVM for return call.  ?

## 2022-02-10 NOTE — Telephone Encounter (Signed)
Spoke with pt this afternoon who stated that she has a large lump on the outside of her vaginal lips since she was started having intercourse again. She denies any pain or fever. Per Joylene John, NP it could be caused by irration to the hair follicle and less likely cancer since it came up quickly. Pt can continue to monitor the area and apply warm compress on it. Do not pop it and up date [place] on Monday. Pt verbalized understanding.

## 2022-02-13 NOTE — Telephone Encounter (Signed)
LM for Ms Fedie to call back to the office to follow up on how if the compresses are effective to affected area.

## 2022-02-14 ENCOUNTER — Telehealth: Payer: Self-pay

## 2022-02-14 NOTE — Telephone Encounter (Signed)
Following up with patient regarding her "vaginal lump". Patient reports it is getting better. She has taken a bath and used warm compresses which she reports helps. The lump is on the left side of her vaginal area. It is smaller and is not painful. Patient states " I think it  may have been the rubbing or friction from intercourse." Advised patient to continue to use the compresses and monitor it. Notify the office with any worsening symptoms, discharge, pain fever or chills. Patient verbalized understanding.

## 2022-02-15 ENCOUNTER — Telehealth: Payer: Self-pay

## 2022-02-15 ENCOUNTER — Encounter: Payer: Self-pay | Admitting: Gynecologic Oncology

## 2022-02-15 NOTE — Telephone Encounter (Signed)
Pt called stating the spot on her vagina has gotten smaller, about the size of "a sm.soft marble", on the outer left side of vaginal lips. No pain/bleeding, just feels uncomfortable when sits/or where panties rub. She has been using warm compresses and taking warm baths. No fever/chills.   She did take a picture and will be sending it through [facility] to show Dr.tucker and Melissa.

## 2022-02-22 ENCOUNTER — Encounter: Payer: Self-pay | Admitting: Gynecologic Oncology

## 2022-03-09 ENCOUNTER — Telehealth: Payer: Self-pay

## 2022-03-09 NOTE — Telephone Encounter (Signed)
Pt left voicemail stating she was having discharge, yellow/brown in color. She requested a call back by Dr.Tucker or Joylene John NP.   Attempted to reach pt, LVM for her to call back with more information in the morning since clinic is now closed.

## 2022-03-10 ENCOUNTER — Encounter: Payer: Self-pay | Admitting: Gynecologic Oncology

## 2022-03-10 NOTE — Telephone Encounter (Signed)
I spoke to patient today regarding her message she left yesterday.  She states she had intercourse the night before and she noticed a "yellow/brown tinged stain" in her underwear yesterday. She doesn't see anything on toilet tissue when wipes and doesn't feel anything coming out of vagina.It was just a stain in underwear. No odor, burning or pain. She just felt the need to mention it. She is going to send a picture.   She did state and wanted to mention when she does have intercourse, no pain on insertion or with friction, however, there is a little irritating feeling at the back of the vagina like towards her rectum. Again, no bleeding or pain afterwards.   Pt aware I will notify Dr.Tucker and will call her back this afternoon. She voiced an understanding.

## 2022-03-10 NOTE — Telephone Encounter (Signed)
VM left for patient to call office.

## 2022-03-10 NOTE — Telephone Encounter (Signed)
Pt aware of Dr.Tuckers message, she states that is what she wanted to hear and was thankful for the call.

## 2022-04-06 ENCOUNTER — Encounter: Payer: Self-pay | Admitting: Gynecologic Oncology

## 2022-04-07 ENCOUNTER — Inpatient Hospital Stay: Payer: Commercial Managed Care - PPO | Attending: Gynecologic Oncology | Admitting: Gynecologic Oncology

## 2022-04-07 ENCOUNTER — Other Ambulatory Visit: Payer: Self-pay

## 2022-04-07 VITALS — BP 137/95 | HR 78 | Temp 97.8°F | Resp 20 | Ht 67.5 in | Wt 299.0 lb

## 2022-04-07 DIAGNOSIS — Z8541 Personal history of malignant neoplasm of cervix uteri: Secondary | ICD-10-CM | POA: Insufficient documentation

## 2022-04-07 DIAGNOSIS — Z923 Personal history of irradiation: Secondary | ICD-10-CM | POA: Insufficient documentation

## 2022-04-07 DIAGNOSIS — Z90722 Acquired absence of ovaries, bilateral: Secondary | ICD-10-CM | POA: Insufficient documentation

## 2022-04-07 DIAGNOSIS — Z90710 Acquired absence of both cervix and uterus: Secondary | ICD-10-CM | POA: Diagnosis not present

## 2022-04-07 DIAGNOSIS — C539 Malignant neoplasm of cervix uteri, unspecified: Secondary | ICD-10-CM

## 2022-04-07 NOTE — Patient Instructions (Signed)
It was good to see you.  I do not see or feel any evidence of cancer on your exam.  I will call you once I get your CT results back in October.  I will see you back in December.  As always, please call if anything changes prior to that visit.

## 2022-04-07 NOTE — Progress Notes (Signed)
Gynecologic Oncology Return Clinic Visit  04/07/2022  Reason for Visit: Surveillance in the setting of cervical cancer history  Treatment History: Oncology History Overview Note  PD-L1 CPS 60%   Cervical cancer ([place])  01/05/2021 Initial Diagnosis   In June, she had a period for 3-6 days with significant back pain and pelvic cramping.  This was more bleeding than she had normally had    06/07/2021 Pathology Results   [id number] Cervical biopsy showed adenocarcinoma    06/09/2021 Procedure   Patient underwent colposcopy on 11/3.  Findings at that time was a fleshy, friable mass occupying most of the cervix measuring 3 x 3 cm with polypoid protrusions.  Cervical os unable to be identified.  Biopsies were taken that revealed at least adenocarcinoma in situ.   06/15/2021 Initial Diagnosis   Cervical cancer ([place])   06/24/2021 PET scan   1. Ill-defined soft tissue and hypermetabolic activity at the expected area of the cervix, compatible with primary malignancy. 2. No evidence of FDG avid metastatic disease in the chest, abdomen or pelvis. 3. Hypermetabolic activity of the left ovary, likely physiologic.   07/01/2021 Cancer Staging   Staging form: Cervix Uteri, AJCC Version 9 - Clinical stage from 07/01/2021: FIGO Stage IB3 (cT1b3, cN0, cM0) - Signed by Heath Lark, MD on 07/01/2021 Stage prefix: Initial diagnosis   07/14/2021 - 08/19/2021 Radiation Therapy   IMRT Declined HDR   10/04/2021 Surgery   Modified radical robotic-assisted laparoscopic total hysterectomy with bilateral salpingectomy, left oophorectomy  Findings: On EUA, cervix 3-4cm, somewhat flush with the left vaginal sidewall, not firm or nodular, no parametrial involvement. On speculum exam, small, <1 cm discoloration around the cervical os that appears c/w tumor vs treated tumor. On intra-abdominal entry, normal upper abdominal survey. Normal omentum, small and large bowel. Somewhat bulbous 8 cm uterus. Right ovary surgically  absent. Bilateral tubes normal appearing with some evidence of possibly endosalpingiosis on the left tubes. Normal left ovary. Some mild retroperitoneal fibrosis and edema consistent recent radiation. Some adhesions between the bladder and LUS/Cervix. No obvious extra-cervical evidence of disease.  Given concern for close margin to the cervix on the left and posterior aspect, additional left and posterior vaginal margin taken. On cystoscopy, bladder including dome intact, good efflux noted from bilateral ureteral orifices.    10/04/2021 Pathology Results   A. UTERUS, CERIVX, LEFT OVARY, BILATERAL FALLOPIAN TUBES:  - Invasive adenocarcinoma, high nuclear-grade, involving all quadrants  of the cervix  - Carcinoma invades for a maximum depth of 0.7 cm  - Cervical margins are negative for carcinoma  - Carcinoma involves anterior and posterior lower uterine segments  - Rest of endometrium shows atrophic and metaplastic changes, negative  for carcinoma  - Bilateral fallopian tubes and ovaries are negative for carcinoma  - No evidence of lymphovascular invasion  - See oncology table  - See comment   B. VAGINAL MARGIN, LEFT, EXCISION:  - Negative for carcinoma  No gross cervical lesion noted on examination of the tissue.  A.   The closest cervical deep margin is 0.3 cm from carcinoma. FINAL MICROSCOPIC DIAGNOSIS:   A. UTERUS, CERIVX, LEFT OVARY, BILATERAL FALLOPIAN TUBES:  - Invasive adenocarcinoma, high nuclear-grade, involving all quadrants  of the cervix  - Carcinoma invades for a maximum depth of 0.7 cm  - Cervical margins are negative for carcinoma  - Carcinoma involves anterior and posterior lower uterine segments  - Rest of endometrium shows atrophic and metaplastic changes, negative  for carcinoma  - Bilateral  fallopian tubes and ovaries are negative for carcinoma  - No evidence of lymphovascular invasion  - See oncology table  - See comment   B. VAGINAL MARGIN, LEFT, EXCISION:   - Negative for carcinoma       COMMENT:   A.  Dr. Saralyn Pilar reviewed the case and concurs with the diagnosis.  A  gross lesion was not identified in the cervix, but the tumor appears to  be less than 2 cm in greatest linear extent based on microscopic  examination.     ONCOLOGY TABLE:   UTERINE CERVIX, CARCINOMA: Resection   Procedure: Total hysterectomy and bilateral salpingo-oophorectomy  Tumor Size: Cannot be determined  Histologic Type: Adenocarcinoma  Histologic Grade: High grade  Stromal Invasion: Present       Depth of stromal invasion (mm): 7 mm  Other Tissue/ Organ: Not applicable  Margins: All margins negative for invasive carcinoma  Margin Status for HSIL or AIS: All margins negative for HSIL or AIS  Lymphovascular invasion: Not identified       Regional Lymph Nodes: Not applicable (no lymph nodes submitted or  found)  Distant sites involved: Not applicable  Pathologic Stage Classification (pTNM, AJCC 8th Edition): pT1b1, pN not  assigned  Ancillary Studies: Can be performed upon request  Representative Tumor Block: A6    01/14/2022 Imaging   CT A/P: 1. Interval total hysterectomy. In the left hemipelvis, usual location of the ovary but more inferior in position than the ovary on the preoperative scan, there is a 2.8 x 1.2 cm low-density structure which could be the left ovary, postsurgical changes or abnormal lymph node. Consider repeat PET-CT for further study. There is no other suggestion of pelvic adenopathy elsewhere, no abdominal adenopathy. 2. Cystitis versus bladder nondistention. 3. Hepatosplenomegaly with hepatic steatosis. 4. Supraumbilical, umbilical and infraumbilical small anterior wall fat hernias with tiny inguinal fat hernias. 5. Trace ascites in the posterior deep pelvis.     Interval History: Doing well.  Reports she feels great.  Has lots of energy.  Denies any vaginal bleeding.  Reports regular bowel and bladder function.  Denies  any abdominal or pelvic pain.  Notes occasional brown-tinged discharge on her underwear, does not stain her underwear.  Denies any fluid leaking.  Past Medical/Surgical History: Past Medical History:  Diagnosis Date   Cervical cancer ([place])    History of radiation therapy    Cervix 07/14/21-08/19/21-Dr. Gery Pray   Obesity, Class III, BMI 40-49.9 (morbid obesity) ([place])    Ovarian tumor (benign) 12/06/1999    Past Surgical History:  Procedure Laterality Date   CYSTOSCOPY N/A 10/04/2021   Procedure: CYSTOSCOPY;  Surgeon: Lafonda Mosses, MD;  Location: [facility];  Service: Gynecology;  Laterality: N/A;   LAPAROSCOPIC UNILATERAL SALPINGO OOPHERECTOMY     right side - lap   ROBOTIC ASSISTED BILATERAL SALPINGO OOPHERECTOMY N/A 10/04/2021   Procedure: XI ROBOTIC ASSISTED LEFT OOPHORECTOMY;  Surgeon: Lafonda Mosses, MD;  Location: [facility];  Service: Gynecology;  Laterality: N/A;   ROBOTIC ASSISTED LAPAROSCOPIC HYSTERECTOMY AND SALPINGECTOMY N/A 10/04/2021   Procedure: XI ROBOTIC ASSISTED LAPAROSCOPIC MODIFIED RADICAL HYSTERECTOMY AND BILATERAL SALPINGECTOMY;  Surgeon: Lafonda Mosses, MD;  Location: [facility];  Service: Gynecology;  Laterality: N/A;    Family History  Problem Relation Age of Onset   Cancer Mother 30       uterine cancer   Colon cancer Neg Hx    Breast cancer Neg Hx    Ovarian cancer Neg Hx  Endometrial cancer Neg Hx    Pancreatic cancer Neg Hx    Prostate cancer Neg Hx     Social History   Socioeconomic History   Marital status: Divorced    Spouse name: Not on file   Number of children: 2   Years of education: Not on file   Highest education level: Not on file  Occupational History   Occupation: [employer]  Tobacco Use   Smoking status: Never   Smokeless tobacco: Never  Vaping Use   Vaping Use: Never used  Substance and Sexual Activity   Alcohol use: Not Currently   Drug use: No   Sexual activity: Yes    Birth control/protection: None  Other Topics  Concern   Not on file  Social History Narrative   Not on file   Social Determinants of Health   Financial Resource Strain: Not on file  Food Insecurity: Not on file  Transportation Needs: Not on file  Physical Activity: Not on file  Stress: Not on file  Social Connections: Not on file    Current Medications: No current outpatient medications on file.  Review of Systems: Denies appetite changes, fevers, chills, fatigue, unexplained weight changes. Denies hearing loss, neck lumps or masses, mouth sores, ringing in ears or voice changes. Denies cough or wheezing.  Denies shortness of breath. Denies chest pain or palpitations. Denies leg swelling. Denies abdominal distention, pain, blood in stools, constipation, diarrhea, nausea, vomiting, or early satiety. Denies pain with intercourse, dysuria, frequency, hematuria or incontinence. Denies hot flashes, pelvic pain, vaginal bleeding or vaginal discharge.   Denies joint pain, back pain or muscle pain/cramps. Denies itching, rash, or wounds. Denies dizziness, headaches, numbness or seizures. Denies swollen lymph nodes or glands, denies easy bruising or bleeding. Denies anxiety, depression, confusion, or decreased concentration.  Physical Exam: BP (!) 137/95 (BP Location: Right Arm) Comment: MD notified  Pulse 78   Temp 97.8 F (36.6 C) (Oral)   Resp 20   Ht 5' 7.5" (1.715 m)   Wt 299 lb (135.6 kg)   LMP 08/21/2021 Comment: preg.test pending 10/04/21  BMI 46.14 kg/m  General: Alert, oriented, no acute distress. HEENT: Normocephalic, atraumatic, sclera anicteric. Chest: Clear to auscultation bilaterally.  No wheezes or rhonchi Cardiovascular: Regular rate and rhythm, no murmurs. Abdomen: Obese, soft, nontender.  Normoactive bowel sounds.  No masses or hepatosplenomegaly appreciated.  Well-healed incisions. Extremities: Grossly normal range of motion.  Warm, well perfused.  No edema bilaterally. Skin: No rashes or lesions  noted. Lymphatics: No cervical, supraclavicular, or inguinal adenopathy. GU: Normal appearing external genitalia without erythema, excoriation, or lesions.  Speculum exam reveals cuff intact, physiologic discharge, no bleeding.  Suture noted at the midline of the cuff and another against the left vaginal sidewall, removed with a Sc[place].  Bimanual exam reveals cuff intact, no nodularity or masses.  Rectovaginal exam confirms findings.  Laboratory & Radiologic Studies: None new  Assessment & Plan: Maria Davidson is a 41 y.o. 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 CPS 60%  Patient is doing very well.  She is NED on exam today.  Discussed repeating CT scan as we had previously discussed, 3-6 months after her last CT scan.  I will call her with these results.  Per NCCN surveillance recommendations, we will continue with surveillance visits every 3 months.  We reviewed signs and symptoms again  that would be concerning for recurrent cancer, and I stressed the importance of calling if she develops any of these.  24 minutes of total time was spent for this patient encounter, including preparation, face-to-face counseling with the patient and coordination of care, and documentation of the encounter.  Jeral Pinch, MD  Division of Gynecologic Oncology  Department of Obstetrics and Gynecology  [facility]

## 2022-04-28 ENCOUNTER — Encounter: Payer: Self-pay | Admitting: Gynecologic Oncology

## 2022-05-11 ENCOUNTER — Emergency Department (HOSPITAL_BASED_OUTPATIENT_CLINIC_OR_DEPARTMENT_OTHER): Payer: Commercial Managed Care - PPO

## 2022-05-11 ENCOUNTER — Encounter (HOSPITAL_BASED_OUTPATIENT_CLINIC_OR_DEPARTMENT_OTHER): Payer: Self-pay | Admitting: Emergency Medicine

## 2022-05-11 ENCOUNTER — Other Ambulatory Visit: Payer: Self-pay

## 2022-05-11 ENCOUNTER — Emergency Department (HOSPITAL_BASED_OUTPATIENT_CLINIC_OR_DEPARTMENT_OTHER)
Admission: EM | Admit: 2022-05-11 | Discharge: 2022-05-11 | Discharge status: Against Medical Advice | Payer: Commercial Managed Care - PPO | Attending: Emergency Medicine | Admitting: Emergency Medicine

## 2022-05-11 DIAGNOSIS — R079 Chest pain, unspecified: Secondary | ICD-10-CM | POA: Insufficient documentation

## 2022-05-11 DIAGNOSIS — R0602 Shortness of breath: Secondary | ICD-10-CM | POA: Insufficient documentation

## 2022-05-11 DIAGNOSIS — R202 Paresthesia of skin: Secondary | ICD-10-CM | POA: Insufficient documentation

## 2022-05-11 DIAGNOSIS — Z5321 Procedure and treatment not carried out due to patient leaving prior to being seen by health care provider: Secondary | ICD-10-CM | POA: Insufficient documentation

## 2022-05-11 LAB — BASIC METABOLIC PANEL
Anion gap: 7 (ref 5–15)
BUN: 9 mg/dL (ref 6–20)
CO2: 27 mmol/L (ref 22–32)
Calcium: 8.9 mg/dL (ref 8.9–10.3)
Chloride: 104 mmol/L (ref 98–111)
Creatinine, Ser: 0.68 mg/dL (ref 0.44–1.00)
GFR, Estimated: >60 mL/min (ref >60)
Glucose, Bld: 96 mg/dL (ref 70–99)
Potassium: 3.8 mmol/L (ref 3.5–5.1)
Sodium: 138 mmol/L (ref 135–145)

## 2022-05-11 LAB — TROPONIN I (HIGH SENSITIVITY): Troponin I (High Sensitivity): <2 ng/L (ref <18)

## 2022-05-11 LAB — CBC
HCT: 39.2 % (ref 36.0–46.0)
Hemoglobin: 12.5 g/dL (ref 12.0–15.0)
MCH: 27.1 pg (ref 26.0–34.0)
MCHC: 31.9 g/dL (ref 30.0–36.0)
MCV: 85 fL (ref 80.0–100.0)
Platelets: 250 10*3/uL (ref 150–400)
RBC: 4.61 MIL/uL (ref 3.87–5.11)
RDW: 15.6 % — ABNORMAL HIGH (ref 11.5–15.5)
WBC: 5.3 10*3/uL (ref 4.0–10.5)
nRBC: 0 % (ref 0.0–0.2)

## 2022-05-11 NOTE — ED Notes (Signed)
Pt and family seen walking toward the exit door

## 2022-05-11 NOTE — ED Triage Notes (Signed)
Tingling in lower left arm into hand x 3 days - worsened today. Chest pain started tonight while eating dinner. Shob with exertion.

## 2022-05-15 ENCOUNTER — Encounter: Payer: Self-pay | Admitting: Gynecologic Oncology

## 2022-05-20 ENCOUNTER — Ambulatory Visit (HOSPITAL_COMMUNITY)
Admission: RE | Admit: 2022-05-20 | Discharge: 2022-05-20 | Disposition: A | Discharge status: Home / Self Care | Payer: Commercial Managed Care - PPO | Source: Ambulatory Visit | Attending: Gynecologic Oncology | Admitting: Gynecologic Oncology

## 2022-05-20 ENCOUNTER — Encounter (HOSPITAL_COMMUNITY): Payer: Self-pay

## 2022-05-20 DIAGNOSIS — C539 Malignant neoplasm of cervix uteri, unspecified: Secondary | ICD-10-CM | POA: Diagnosis present

## 2022-05-20 MED ORDER — IOHEXOL 300 MG/ML  SOLN
100.0000 mL | Freq: Once | INTRAMUSCULAR | Status: AC | PRN
  Administered 2022-05-20: 100 mL via INTRAVENOUS

## 2022-05-22 ENCOUNTER — Encounter: Payer: Self-pay | Admitting: Gynecologic Oncology

## 2022-05-22 ENCOUNTER — Telehealth: Payer: Self-pay | Admitting: Surgery

## 2022-05-22 NOTE — Telephone Encounter (Signed)
Pt called in requesting CT results from scan on 10/14. Results not yet released, pt advised that Dr Berline Lopes will call her with results. Pt verbalized understanding.

## 2022-08-24 ENCOUNTER — Encounter: Payer: Self-pay | Admitting: Gynecologic Oncology

## 2022-08-25 ENCOUNTER — Other Ambulatory Visit: Payer: Self-pay

## 2022-08-25 ENCOUNTER — Other Ambulatory Visit (HOSPITAL_COMMUNITY)
Admission: RE | Admit: 2022-08-25 | Discharge: 2022-08-25 | Disposition: A | Discharge status: Home / Self Care | Payer: Commercial Managed Care - PPO | Source: Ambulatory Visit | Attending: Gynecologic Oncology | Admitting: Gynecologic Oncology

## 2022-08-25 ENCOUNTER — Inpatient Hospital Stay: Payer: Commercial Managed Care - PPO | Attending: Gynecologic Oncology | Admitting: Gynecologic Oncology

## 2022-08-25 ENCOUNTER — Encounter: Payer: Self-pay | Admitting: Gynecologic Oncology

## 2022-08-25 VITALS — BP 137/79 | HR 93 | Temp 98.0°F | Resp 14 | Wt 310.0 lb

## 2022-08-25 DIAGNOSIS — C539 Malignant neoplasm of cervix uteri, unspecified: Secondary | ICD-10-CM | POA: Insufficient documentation

## 2022-08-25 DIAGNOSIS — Z90710 Acquired absence of both cervix and uterus: Secondary | ICD-10-CM | POA: Diagnosis not present

## 2022-08-25 DIAGNOSIS — Z8541 Personal history of malignant neoplasm of cervix uteri: Secondary | ICD-10-CM

## 2022-08-25 DIAGNOSIS — Z90722 Acquired absence of ovaries, bilateral: Secondary | ICD-10-CM | POA: Insufficient documentation

## 2022-08-25 DIAGNOSIS — Z923 Personal history of irradiation: Secondary | ICD-10-CM | POA: Diagnosis not present

## 2022-08-25 NOTE — Patient Instructions (Signed)
It was good to see you today.  I do not see or feel any evidence of cancer recurrence on your exam.  I will see you for follow-up in 3 months.  As always, if you develop any new and concerning symptoms before your next visit, please call to see me sooner.  

## 2022-08-25 NOTE — Progress Notes (Signed)
Gynecologic Oncology Return Clinic Visit  08/25/22  Reason for Visit: follow-up in the setting of cervical cancer  Treatment History: Oncology History Overview Note  PD-L1 CPS 60%   Cervical cancer ([place])  01/05/2021 Initial Diagnosis   In June, she had a period for 3-6 days with significant back pain and pelvic cramping.  This was more bleeding than she had normally had    06/07/2021 Pathology Results   [id number] Cervical biopsy showed adenocarcinoma    06/09/2021 Procedure   Patient underwent colposcopy on 11/3.  Findings at that time was a fleshy, friable mass occupying most of the cervix measuring 3 x 3 cm with polypoid protrusions.  Cervical os unable to be identified.  Biopsies were taken that revealed at least adenocarcinoma in situ.   06/15/2021 Initial Diagnosis   Cervical cancer ([place])   06/24/2021 PET scan   1. Ill-defined soft tissue and hypermetabolic activity at the expected area of the cervix, compatible with primary malignancy. 2. No evidence of FDG avid metastatic disease in the chest, abdomen or pelvis. 3. Hypermetabolic activity of the left ovary, likely physiologic.   07/01/2021 Cancer Staging   Staging form: Cervix Uteri, AJCC Version 9 - Clinical stage from 07/01/2021: FIGO Stage IB3 (cT1b3, cN0, cM0) - Signed by Heath Lark, MD on 07/01/2021 Stage prefix: Initial diagnosis   07/14/2021 - 08/19/2021 Radiation Therapy   IMRT Declined HDR   10/04/2021 Surgery   Modified radical robotic-assisted laparoscopic total hysterectomy with bilateral salpingectomy, left oophorectomy  Findings: On EUA, cervix 3-4cm, somewhat flush with the left vaginal sidewall, not firm or nodular, no parametrial involvement. On speculum exam, small, <1 cm discoloration around the cervical os that appears c/w tumor vs treated tumor. On intra-abdominal entry, normal upper abdominal survey. Normal omentum, small and large bowel. Somewhat bulbous 8 cm uterus. Right ovary surgically absent.  Bilateral tubes normal appearing with some evidence of possibly endosalpingiosis on the left tubes. Normal left ovary. Some mild retroperitoneal fibrosis and edema consistent recent radiation. Some adhesions between the bladder and LUS/Cervix. No obvious extra-cervical evidence of disease.  Given concern for close margin to the cervix on the left and posterior aspect, additional left and posterior vaginal margin taken. On cystoscopy, bladder including dome intact, good efflux noted from bilateral ureteral orifices.    10/04/2021 Pathology Results   A. UTERUS, CERIVX, LEFT OVARY, BILATERAL FALLOPIAN TUBES:  - Invasive adenocarcinoma, high nuclear-grade, involving all quadrants  of the cervix  - Carcinoma invades for a maximum depth of 0.7 cm  - Cervical margins are negative for carcinoma  - Carcinoma involves anterior and posterior lower uterine segments  - Rest of endometrium shows atrophic and metaplastic changes, negative  for carcinoma  - Bilateral fallopian tubes and ovaries are negative for carcinoma  - No evidence of lymphovascular invasion  - See oncology table  - See comment   B. VAGINAL MARGIN, LEFT, EXCISION:  - Negative for carcinoma  No gross cervical lesion noted on examination of the tissue.  A.   The closest cervical deep margin is 0.3 cm from carcinoma. FINAL MICROSCOPIC DIAGNOSIS:   A. UTERUS, CERIVX, LEFT OVARY, BILATERAL FALLOPIAN TUBES:  - Invasive adenocarcinoma, high nuclear-grade, involving all quadrants  of the cervix  - Carcinoma invades for a maximum depth of 0.7 cm  - Cervical margins are negative for carcinoma  - Carcinoma involves anterior and posterior lower uterine segments  - Rest of endometrium shows atrophic and metaplastic changes, negative  for carcinoma  - Bilateral fallopian  tubes and ovaries are negative for carcinoma  - No evidence of lymphovascular invasion  - See oncology table  - See comment   B. VAGINAL MARGIN, LEFT, EXCISION:  -  Negative for carcinoma       COMMENT:   A.  Dr. Saralyn Pilar reviewed the case and concurs with the diagnosis.  A  gross lesion was not identified in the cervix, but the tumor appears to  be less than 2 cm in greatest linear extent based on microscopic  examination.     ONCOLOGY TABLE:   UTERINE CERVIX, CARCINOMA: Resection   Procedure: Total hysterectomy and bilateral salpingo-oophorectomy  Tumor Size: Cannot be determined  Histologic Type: Adenocarcinoma  Histologic Grade: High grade  Stromal Invasion: Present       Depth of stromal invasion (mm): 7 mm  Other Tissue/ Organ: Not applicable  Margins: All margins negative for invasive carcinoma  Margin Status for HSIL or AIS: All margins negative for HSIL or AIS  Lymphovascular invasion: Not identified       Regional Lymph Nodes: Not applicable (no lymph nodes submitted or  found)  Distant sites involved: Not applicable  Pathologic Stage Classification (pTNM, AJCC 8th Edition): pT1b1, pN not  assigned  Ancillary Studies: Can be performed upon request  Representative Tumor Block: A6    01/14/2022 Imaging   CT A/P: 1. Interval total hysterectomy. In the left hemipelvis, usual location of the ovary but more inferior in position than the ovary on the preoperative scan, there is a 2.8 x 1.2 cm low-density structure which could be the left ovary, postsurgical changes or abnormal lymph node. Consider repeat PET-CT for further study. There is no other suggestion of pelvic adenopathy elsewhere, no abdominal adenopathy. 2. Cystitis versus bladder nondistention. 3. Hepatosplenomegaly with hepatic steatosis. 4. Supraumbilical, umbilical and infraumbilical small anterior wall fat hernias with tiny inguinal fat hernias. 5. Trace ascites in the posterior deep pelvis.     Interval History: Patient reports doing well.  Denies any abdominal or pelvic pain.  Reports complete resolution of any vaginal discharge or sensation of leaking.   Denies any vaginal bleeding.  Reports baseline bowel function.  Denies any bladder symptoms.  Had 1 episode since her last visit with me of mid abdominal cramping, thinks this was related to diet.  Past Medical/Surgical History: Past Medical History:  Diagnosis Date   Cervical cancer ([place])    History of radiation therapy    Cervix 07/14/21-08/19/21-Dr. Gery Pray   Obesity, Class III, BMI 40-49.9 (morbid obesity) ([place])    Ovarian tumor (benign) 12/06/1999    Past Surgical History:  Procedure Laterality Date   CYSTOSCOPY N/A 10/04/2021   Procedure: CYSTOSCOPY;  Surgeon: Lafonda Mosses, MD;  Location: [facility];  Service: Gynecology;  Laterality: N/A;   LAPAROSCOPIC UNILATERAL SALPINGO OOPHERECTOMY     right side - lap   ROBOTIC ASSISTED BILATERAL SALPINGO OOPHERECTOMY N/A 10/04/2021   Procedure: XI ROBOTIC ASSISTED LEFT OOPHORECTOMY;  Surgeon: Lafonda Mosses, MD;  Location: [facility];  Service: Gynecology;  Laterality: N/A;   ROBOTIC ASSISTED LAPAROSCOPIC HYSTERECTOMY AND SALPINGECTOMY N/A 10/04/2021   Procedure: XI ROBOTIC ASSISTED LAPAROSCOPIC MODIFIED RADICAL HYSTERECTOMY AND BILATERAL SALPINGECTOMY;  Surgeon: Lafonda Mosses, MD;  Location: [facility];  Service: Gynecology;  Laterality: N/A;    Family History  Problem Relation Age of Onset   Cancer Mother 42       uterine cancer   Colon cancer Neg Hx    Breast cancer Neg Hx  Ovarian cancer Neg Hx    Endometrial cancer Neg Hx    Pancreatic cancer Neg Hx    Prostate cancer Neg Hx     Social History   Socioeconomic History   Marital status: Divorced    Spouse name: Not on file   Number of children: 2   Years of education: Not on file   Highest education level: Not on file  Occupational History   Occupation: [employer]  Tobacco Use   Smoking status: Never   Smokeless tobacco: Never  Vaping Use   Vaping Use: Never used  Substance and Sexual Activity   Alcohol use: Not Currently   Drug use: No   Sexual activity:  Yes    Birth control/protection: None  Other Topics Concern   Not on file  Social History Narrative   Not on file   Social Determinants of Health   Financial Resource Strain: Not on file  Food Insecurity: Not on file  Transportation Needs: Not on file  Physical Activity: Not on file  Stress: Not on file  Social Connections: Not on file    Current Medications: No current outpatient medications on file.  Review of Systems: Denies appetite changes, fevers, chills, fatigue, unexplained weight changes. Denies hearing loss, neck lumps or masses, mouth sores, ringing in ears or voice changes. Denies cough or wheezing.  Denies shortness of breath. Denies chest pain or palpitations. Denies leg swelling. Denies abdominal distention, pain, blood in stools, constipation, diarrhea, nausea, vomiting, or early satiety. Denies pain with intercourse, dysuria, frequency, hematuria or incontinence. Denies hot flashes, pelvic pain, vaginal bleeding or vaginal discharge.   Denies joint pain, back pain or muscle pain/cramps. Denies itching, rash, or wounds. Denies dizziness, headaches, numbness or seizures. Denies swollen lymph nodes or glands, denies easy bruising or bleeding. Denies anxiety, depression, confusion, or decreased concentration.  Physical Exam: BP 137/79 (BP Location: Left Wrist, Patient Position: Sitting) Comment: rechecked BP on the R side  Pulse 93   Temp 98 F (36.7 C)   Resp 14   Wt (!) 310 lb (140.6 kg)   LMP 08/21/2021 Comment: preg.test pending 10/04/21  SpO2 100%   BMI 47.84 kg/m  General: Alert, oriented, no acute distress. HEENT: Normocephalic, atraumatic, sclera anicteric. Chest: Clear to auscultation bilaterally.  No wheezes or rhonchi Cardiovascular: Regular rate and rhythm, no murmurs. Abdomen: Obese, soft, nontender.  Normoactive bowel sounds.  No masses or hepatosplenomegaly appreciated.  Well-healed incisions. Extremities: Grossly normal range of motion.   Warm, well perfused.  No edema bilaterally. Skin: No rashes or lesions noted. Lymphatics: No cervical, supraclavicular, or inguinal adenopathy. GU: Normal appearing external genitalia without erythema, excoriation, or lesions.  Speculum exam reveals cuff intact, physiologic discharge, no bleeding.  Cuff intact.  Bimanual exam reveals cuff intact, no nodularity or masses.  Rectovaginal exam confirms findings.   Laboratory & Radiologic Studies: CT A/P on 10/14: 1. No evidence of recurrent or metastatic disease. 2. Mild splenomegaly.  Assessment & Plan: Maria Davidson is a 42 y.o. 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 PDL1: CPS 60%   Patient is doing well, is NED on exam today.  Asymptomatic.  Reviewed CT scan again from October which showed no evidence of recurrent disease.  Will plan for 1 additional CT scan 6 months afterwards, in April, and if this is normal, will stop routine surveillance imaging unless  patient develops new symptoms or has concerning exam findings.  Pap and HPV collected today.   Per NCCN surveillance recommendations, we will continue with surveillance visits every 3 months.  We reviewed signs and symptoms again that would be concerning for recurrent cancer, and I stressed the importance of calling if she develops any of these.  20 minutes of total time was spent for this patient encounter, including preparation, face-to-face counseling with the patient and coordination of care, and documentation of the encounter.  Jeral Pinch, MD  Division of Gynecologic Oncology  Department of Obstetrics and Gynecology  [facility]

## 2022-08-28 ENCOUNTER — Encounter: Payer: Self-pay | Admitting: Gynecologic Oncology

## 2022-09-01 LAB — CYTOLOGY - PAP
Comment: NEGATIVE
Diagnosis: NEGATIVE
Diagnosis: REACTIVE
High risk HPV: NEGATIVE

## 2022-09-05 ENCOUNTER — Other Ambulatory Visit: Payer: Self-pay | Admitting: Gynecologic Oncology

## 2022-09-05 ENCOUNTER — Encounter: Payer: Self-pay | Admitting: Gynecologic Oncology

## 2022-09-05 DIAGNOSIS — B379 Candidiasis, unspecified: Secondary | ICD-10-CM

## 2022-09-05 MED ORDER — FLUCONAZOLE 150 MG PO TABS: 150.0000 mg | ORAL_TABLET | Freq: Every day | ORAL | 0 refills | Status: DC

## 2022-10-17 ENCOUNTER — Telehealth: Payer: Self-pay

## 2022-10-17 NOTE — Telephone Encounter (Signed)
I connected with [name] regarding her most recent Mychart message of S&S of vaginal discharge.   Triage protocol for vaginal discharge was reviewed with pt  Pt states when she was here (1/19) it was mentioned to her, upon pelvic exam, there was a notice of yeast. Pt was not having any symptoms and did not take the Diflucan right away. She states Thursday and Friday she did have intercourse and either Friday night (2/8) or Saturday morning (2/9)  she noticed a little itching, she then took the Diflucan. On Sunday she noticed thin white discharge in her panties. No odor/no color, no pelvic/abdominal pain, no fever/chills, no nausea or vomiting or diarrhea. She has not noticed any discharge since Sunday.  Pt was reassured and advised to watch for any above symptoms. Maria John NP will be notified and pt will be contacted with any advise.  She voiced an understanding

## 2022-11-14 ENCOUNTER — Encounter: Payer: Self-pay | Admitting: Gynecologic Oncology

## 2022-11-15 ENCOUNTER — Telehealth: Payer: Self-pay | Admitting: *Deleted

## 2022-11-15 ENCOUNTER — Telehealth: Payer: Self-pay

## 2022-11-15 NOTE — Telephone Encounter (Signed)
See below message from [facility]  LVM for patient to call office

## 2022-11-15 NOTE — Telephone Encounter (Signed)
Per patient request cancel appt on 4/19 and moved to a phone visit on 4/29 at 4 pm.   Explained to the patient in regards to her message for contrast with the CT scan "Per Dr Pricilla Holm in regards to her saying she only wants 1 type of contrast, please let her know that both forms of contrast are recommended. The oral contrast goes into her bowels and help some see down in the pelvis better and the IV contrast is needed as well. There could be a chance that if she did not do both that she may need to have additional imaging if they were not able to see clearly."  Patient given the phone number to central scheduling to see if she can pick up the contrast early

## 2022-11-15 NOTE — Telephone Encounter (Signed)
-----   Message from Doylene Bode, NP sent at 11/15/2022 10:33 AM EDT ----- There is a patient MyChart message from her in the inbox.  It appears that she wants a phone call about the CT scan results.  You can see if there is an available spot for a phone visit somewhere.  In regards to her saying she only wants 1 type of contrast, please let her know that both forms of contrast are recommended.  The oral contrast goes into her bowels and help some see down in the pelvis better and the IV contrast is needed as well.  There could be a chance that if she did not do both that she may need to have additional imaging if they were not able to see clearly

## 2022-11-20 ENCOUNTER — Ambulatory Visit (HOSPITAL_COMMUNITY): Payer: Commercial Managed Care - PPO

## 2022-11-24 ENCOUNTER — Inpatient Hospital Stay: Payer: Commercial Managed Care - PPO | Admitting: Gynecologic Oncology

## 2022-11-30 ENCOUNTER — Ambulatory Visit (HOSPITAL_COMMUNITY): Admission: RE | Admit: 2022-11-30 | Payer: Commercial Managed Care - PPO | Source: Ambulatory Visit

## 2022-11-30 ENCOUNTER — Telehealth: Payer: Self-pay

## 2022-11-30 NOTE — Telephone Encounter (Signed)
Maria Davidson called to say that she had to cancell her CT today as she is sick.  She will call to reschedule scan when she is feeling better. She will call the office to let us know new date for scan to schedule a telephone visit with Dr. Pricilla Holm a couple of days later to review the results.

## 2023-01-03 ENCOUNTER — Telehealth: Payer: Commercial Managed Care - PPO | Admitting: Gynecologic Oncology

## 2023-02-06 ENCOUNTER — Encounter: Payer: Self-pay | Admitting: Gynecologic Oncology

## 2023-02-06 ENCOUNTER — Telehealth: Payer: Self-pay | Admitting: *Deleted

## 2023-02-06 NOTE — Telephone Encounter (Signed)
Spoke with Maria Davidson and relayed message from Dr. Pricilla Holm in regards to CT scan. Advised patient that the office would call her in the morning and schedule a follow up in August with Dr. Pricilla Holm. Pt Thanked the office for calling and had no further concerns at this time.

## 2023-02-06 NOTE — Telephone Encounter (Signed)
Spoke with Maria Davidson who called the office to speak with Dr. Pricilla Holm.  Patient canceled her CT scan on April 25 th. And her phone visit with Dr. Pricilla Holm in relation to the CT scan.   Patient is calling today to get advice in regards to wether she needs the CT scan and asking for Dr. Winferd Humphrey recommendation. Pt denies any symptoms currently, but spoke about the previous scheduled CT scan as a follow up from the CT scan prior to the scan that was scheduled on April 25th. Advised patient that her message would be forwarded to Dr. Pricilla Holm. Patient also states she will send a MyChart message to Dr. Pricilla Holm as well.

## 2023-02-06 NOTE — Telephone Encounter (Signed)
Not mandatory - we had discussed one additional CT scan after her last one given some deviation from typical treatment. She is overdue for a visit with me though. So even if she'd like to hold off on the CT, I'd like her to get scheduled for follow-up. Thank you

## 2023-02-07 ENCOUNTER — Telehealth: Payer: Self-pay | Admitting: *Deleted

## 2023-02-07 NOTE — Telephone Encounter (Signed)
Spoke with Maria Davidson this morning and scheduled a follow up appointment with Dr. Pricilla Holm on Friday, August 16 th at 4:15 pm. Patient agreed to date and time and had no further questions or concerns at this time.

## 2023-03-20 ENCOUNTER — Encounter: Payer: Self-pay | Admitting: Gynecologic Oncology

## 2023-03-23 ENCOUNTER — Inpatient Hospital Stay: Payer: Commercial Managed Care - PPO | Admitting: Gynecologic Oncology

## 2023-03-23 DIAGNOSIS — C539 Malignant neoplasm of cervix uteri, unspecified: Secondary | ICD-10-CM

## 2023-03-23 NOTE — Progress Notes (Unsigned)
Gynecologic Oncology Return Clinic Visit  03/23/23  Reason for Visit: follow-up in the setting of cervical cancer   Treatment History: Oncology History Overview Note  PD-L1 CPS 60%   Cervical cancer (HCC)  01/05/2021 Initial Diagnosis   In June, she had a period for 3-6 days with significant back pain and pelvic cramping.  This was more bleeding than she had normally had    06/07/2021 Pathology Results   [id number] Cervical biopsy showed adenocarcinoma    06/09/2021 Procedure   Patient underwent colposcopy on 11/3.  Findings at that time was a fleshy, friable mass occupying most of the cervix measuring 3 x 3 cm with polypoid protrusions.  Cervical os unable to be identified.  Biopsies were taken that revealed at least adenocarcinoma in situ.   06/15/2021 Initial Diagnosis   Cervical cancer (HCC)   06/24/2021 PET scan   1. Ill-defined soft tissue and hypermetabolic activity at the expected area of the cervix, compatible with primary malignancy. 2. No evidence of FDG avid metastatic disease in the chest, abdomen or pelvis. 3. Hypermetabolic activity of the left ovary, likely physiologic.   07/01/2021 Cancer Staging   Staging form: Cervix Uteri, AJCC Version 9 - Clinical stage from 07/01/2021: FIGO Stage IB3 (cT1b3, cN0, cM0) - Signed by Artis Delay, MD on 07/01/2021 Stage prefix: Initial diagnosis   07/14/2021 - 08/19/2021 Radiation Therapy   IMRT Declined HDR   10/04/2021 Surgery   Modified radical robotic-assisted laparoscopic total hysterectomy with bilateral salpingectomy, left oophorectomy  Findings: On EUA, cervix 3-4cm, somewhat flush with the left vaginal sidewall, not firm or nodular, no parametrial involvement. On speculum exam, small, <1 cm discoloration around the cervical os that appears c/w tumor vs treated tumor. On intra-abdominal entry, normal upper abdominal survey. Normal omentum, small and large bowel. Somewhat bulbous 8 cm uterus. Right ovary surgically absent.  Bilateral tubes normal appearing with some evidence of possibly endosalpingiosis on the left tubes. Normal left ovary. Some mild retroperitoneal fibrosis and edema consistent recent radiation. Some adhesions between the bladder and LUS/Cervix. No obvious extra-cervical evidence of disease.  Given concern for close margin to the cervix on the left and posterior aspect, additional left and posterior vaginal margin taken. On cystoscopy, bladder including dome intact, good efflux noted from bilateral ureteral orifices.    10/04/2021 Pathology Results   A. UTERUS, CERIVX, LEFT OVARY, BILATERAL FALLOPIAN TUBES:  - Invasive adenocarcinoma, high nuclear-grade, involving all quadrants  of the cervix  - Carcinoma invades for a maximum depth of 0.7 cm  - Cervical margins are negative for carcinoma  - Carcinoma involves anterior and posterior lower uterine segments  - Rest of endometrium shows atrophic and metaplastic changes, negative  for carcinoma  - Bilateral fallopian tubes and ovaries are negative for carcinoma  - No evidence of lymphovascular invasion  - See oncology table  - See comment   B. VAGINAL MARGIN, LEFT, EXCISION:  - Negative for carcinoma  No gross cervical lesion noted on examination of the tissue.  A.   The closest cervical deep margin is 0.3 cm from carcinoma. FINAL MICROSCOPIC DIAGNOSIS:   A. UTERUS, CERIVX, LEFT OVARY, BILATERAL FALLOPIAN TUBES:  - Invasive adenocarcinoma, high nuclear-grade, involving all quadrants  of the cervix  - Carcinoma invades for a maximum depth of 0.7 cm  - Cervical margins are negative for carcinoma  - Carcinoma involves anterior and posterior lower uterine segments  - Rest of endometrium shows atrophic and metaplastic changes, negative  for carcinoma  - Bilateral  fallopian tubes and ovaries are negative for carcinoma  - No evidence of lymphovascular invasion  - See oncology table  - See comment   B. VAGINAL MARGIN, LEFT, EXCISION:  -  Negative for carcinoma       COMMENT:   A.  Dr. Luisa Hart reviewed the case and concurs with the diagnosis.  A  gross lesion was not identified in the cervix, but the tumor appears to  be less than 2 cm in greatest linear extent based on microscopic  examination.     ONCOLOGY TABLE:   UTERINE CERVIX, CARCINOMA: Resection   Procedure: Total hysterectomy and bilateral salpingo-oophorectomy  Tumor Size: Cannot be determined  Histologic Type: Adenocarcinoma  Histologic Grade: High grade  Stromal Invasion: Present       Depth of stromal invasion (mm): 7 mm  Other Tissue/ Organ: Not applicable  Margins: All margins negative for invasive carcinoma  Margin Status for HSIL or AIS: All margins negative for HSIL or AIS  Lymphovascular invasion: Not identified       Regional Lymph Nodes: Not applicable (no lymph nodes submitted or  found)  Distant sites involved: Not applicable  Pathologic Stage Classification (pTNM, AJCC 8th Edition): pT1b1, pN not  assigned  Ancillary Studies: Can be performed upon request  Representative Tumor Block: A6    01/14/2022 Imaging   CT A/P: 1. Interval total hysterectomy. In the left hemipelvis, usual location of the ovary but more inferior in position than the ovary on the preoperative scan, there is a 2.8 x 1.2 cm low-density structure which could be the left ovary, postsurgical changes or abnormal lymph node. Consider repeat PET-CT for further study. There is no other suggestion of pelvic adenopathy elsewhere, no abdominal adenopathy. 2. Cystitis versus bladder nondistention. 3. Hepatosplenomegaly with hepatic steatosis. 4. Supraumbilical, umbilical and infraumbilical small anterior wall fat hernias with tiny inguinal fat hernias. 5. Trace ascites in the posterior deep pelvis.     Interval History: ***  Past Medical/Surgical History: Past Medical History:  Diagnosis Date   Cervical cancer (HCC)    History of radiation therapy    Cervix  07/14/21-08/19/21-Dr. Antony Blackbird   Obesity, Class III, BMI 40-49.9 (morbid obesity) (HCC)    Ovarian tumor (benign) 12/06/1999    Past Surgical History:  Procedure Laterality Date   CYSTOSCOPY N/A 10/04/2021   Procedure: CYSTOSCOPY;  Surgeon: Carver Fila, MD;  Location: [facility];  Service: Gynecology;  Laterality: N/A;   LAPAROSCOPIC UNILATERAL SALPINGO OOPHERECTOMY     right side - lap   ROBOTIC ASSISTED BILATERAL SALPINGO OOPHERECTOMY N/A 10/04/2021   Procedure: XI ROBOTIC ASSISTED LEFT OOPHORECTOMY;  Surgeon: Carver Fila, MD;  Location: [facility];  Service: Gynecology;  Laterality: N/A;   ROBOTIC ASSISTED LAPAROSCOPIC HYSTERECTOMY AND SALPINGECTOMY N/A 10/04/2021   Procedure: XI ROBOTIC ASSISTED LAPAROSCOPIC MODIFIED RADICAL HYSTERECTOMY AND BILATERAL SALPINGECTOMY;  Surgeon: Carver Fila, MD;  Location: [facility];  Service: Gynecology;  Laterality: N/A;    Family History  Problem Relation Age of Onset   Cancer Mother 8       uterine cancer   Colon cancer Neg Hx    Breast cancer Neg Hx    Ovarian cancer Neg Hx    Endometrial cancer Neg Hx    Pancreatic cancer Neg Hx    Prostate cancer Neg Hx     Social History   Socioeconomic History   Marital status: Divorced    Spouse name: Not on file   Number of children: 2  Years of education: Not on file   Highest education level: Not on file  Occupational History   Occupation: [employer]  Tobacco Use   Smoking status: Never   Smokeless tobacco: Never  Vaping Use   Vaping status: Never Used  Substance and Sexual Activity   Alcohol use: Not Currently   Drug use: No   Sexual activity: Yes    Birth control/protection: None  Other Topics Concern   Not on file  Social History Narrative   Not on file   Social Determinants of Health   Financial Resource Strain: Not on file  Food Insecurity: Not on file  Transportation Needs: Not on file  Physical Activity: Not on file  Stress: Not on file  Social Connections:  Unknown (12/06/2021)   Received from [facility]   Social Network    Social Network: Not on file    Current Medications: No current outpatient medications on file.  Review of Systems: Denies appetite changes, fevers, chills, fatigue, unexplained weight changes. Denies hearing loss, neck lumps or masses, mouth sores, ringing in ears or voice changes. Denies cough or wheezing.  Denies shortness of breath. Denies chest pain or palpitations. Denies leg swelling. Denies abdominal distention, pain, blood in stools, constipation, diarrhea, nausea, vomiting, or early satiety. Denies pain with intercourse, dysuria, frequency, hematuria or incontinence. Denies hot flashes, pelvic pain, vaginal bleeding or vaginal discharge.   Denies joint pain, back pain or muscle pain/cramps. Denies itching, rash, or wounds. Denies dizziness, headaches, numbness or seizures. Denies swollen lymph nodes or glands, denies easy bruising or bleeding. Denies anxiety, depression, confusion, or decreased concentration.  Physical Exam: LMP 08/21/2021 Comment: preg.test pending 10/04/21 General: ***Alert, oriented, no acute distress. HEENT: ***Posterior oropharynx clear, sclera anicteric. Chest: ***Clear to auscultation bilaterally.  ***Port site clean. Cardiovascular: ***Regular rate and rhythm, no murmurs. Abdomen: ***Obese, soft, nontender.  Normoactive bowel sounds.  No masses or hepatosplenomegaly appreciated.  ***Well-healed scar. Extremities: ***Grossly normal range of motion.  Warm, well perfused.  No edema bilaterally. Skin: ***No rashes or lesions noted. Lymphatics: ***No cervical, supraclavicular, or inguinal adenopathy. GU: Normal appearing external genitalia without erythema, excoriation, or lesions.  Speculum exam reveals ***.  Bimanual exam reveals ***.  ***Rectovaginal exam  confirms ___.  Laboratory & Radiologic Studies: 08/2022: NIML pap, HR HPV negative  Assessment & Plan: Maria Davidson is a 42  y.o. 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 PDL1: CPS 60% CT 10/23: no recurrent disease.   Patient is doing well, is NED on exam today.  Asymptomatic.   ***Reviewed CT scan again from October which showed no evidence of recurrent disease.  Will plan for 1 additional CT scan 6 months afterwards, in April, and if this is normal, will stop routine surveillance imaging unless patient develops new symptoms or has concerning exam findings.   Pap and HPV collected today.   Per NCCN surveillance recommendations, we will continue with surveillance visits every 3 months.  We reviewed signs and symptoms again that would be concerning for recurrent cancer, and I stressed the importance of calling if she develops any of these.  *** minutes of total time was spent for this patient encounter, including preparation, face-to-face counseling with the patient and coordination of care, and documentation of the encounter.  Eugene Garnet, MD  Division of Gynecologic Oncology  Department of Obstetrics and Gynecology  [facility]  [employer]

## 2023-05-04 ENCOUNTER — Inpatient Hospital Stay: Payer: Commercial Managed Care - PPO | Attending: Gynecologic Oncology | Admitting: Gynecologic Oncology

## 2023-05-04 ENCOUNTER — Encounter: Payer: Self-pay | Admitting: Gynecologic Oncology

## 2023-05-04 VITALS — BP 136/82 | HR 82 | Temp 97.8°F | Resp 18 | Wt 319.2 lb

## 2023-05-04 DIAGNOSIS — Z90710 Acquired absence of both cervix and uterus: Secondary | ICD-10-CM | POA: Insufficient documentation

## 2023-05-04 DIAGNOSIS — Z8541 Personal history of malignant neoplasm of cervix uteri: Secondary | ICD-10-CM | POA: Insufficient documentation

## 2023-05-04 DIAGNOSIS — C539 Malignant neoplasm of cervix uteri, unspecified: Secondary | ICD-10-CM

## 2023-05-04 DIAGNOSIS — Z923 Personal history of irradiation: Secondary | ICD-10-CM | POA: Diagnosis not present

## 2023-05-04 NOTE — Patient Instructions (Signed)
It was good to see you today.  I do not see or feel any evidence of cancer recurrence on your exam.  I will see you for follow-up in 4 months.  As always, if you develop any new and concerning symptoms before your next visit, please call to see me sooner.  

## 2023-05-04 NOTE — Progress Notes (Signed)
Gynecologic Oncology Return Clinic Visit  05/04/23  Reason for Visit: Surveillance  Treatment History: Oncology History Overview Note  PD-L1 CPS 60%   Cervical cancer (HCC)  01/05/2021 Initial Diagnosis   In June, she had a period for 3-6 days with significant back pain and pelvic cramping.  This was more bleeding than she had normally had    06/07/2021 Pathology Results   [id number] Cervical biopsy showed adenocarcinoma    06/09/2021 Procedure   Patient underwent colposcopy on 11/3.  Findings at that time was a fleshy, friable mass occupying most of the cervix measuring 3 x 3 cm with polypoid protrusions.  Cervical os unable to be identified.  Biopsies were taken that revealed at least adenocarcinoma in situ.   06/15/2021 Initial Diagnosis   Cervical cancer (HCC)   06/24/2021 PET scan   1. Ill-defined soft tissue and hypermetabolic activity at the expected area of the cervix, compatible with primary malignancy. 2. No evidence of FDG avid metastatic disease in the chest, abdomen or pelvis. 3. Hypermetabolic activity of the left ovary, likely physiologic.   07/01/2021 Cancer Staging   Staging form: Cervix Uteri, AJCC Version 9 - Clinical stage from 07/01/2021: FIGO Stage IB3 (cT1b3, cN0, cM0) - Signed by Artis Delay, MD on 07/01/2021 Stage prefix: Initial diagnosis   07/14/2021 - 08/19/2021 Radiation Therapy   IMRT Declined HDR   10/04/2021 Surgery   Modified radical robotic-assisted laparoscopic total hysterectomy with bilateral salpingectomy, left oophorectomy  Findings: On EUA, cervix 3-4cm, somewhat flush with the left vaginal sidewall, not firm or nodular, no parametrial involvement. On speculum exam, small, <1 cm discoloration around the cervical os that appears c/w tumor vs treated tumor. On intra-abdominal entry, normal upper abdominal survey. Normal omentum, small and large bowel. Somewhat bulbous 8 cm uterus. Right ovary surgically absent. Bilateral tubes normal appearing  with some evidence of possibly endosalpingiosis on the left tubes. Normal left ovary. Some mild retroperitoneal fibrosis and edema consistent recent radiation. Some adhesions between the bladder and LUS/Cervix. No obvious extra-cervical evidence of disease.  Given concern for close margin to the cervix on the left and posterior aspect, additional left and posterior vaginal margin taken. On cystoscopy, bladder including dome intact, good efflux noted from bilateral ureteral orifices.    10/04/2021 Pathology Results   A. UTERUS, CERIVX, LEFT OVARY, BILATERAL FALLOPIAN TUBES:  - Invasive adenocarcinoma, high nuclear-grade, involving all quadrants  of the cervix  - Carcinoma invades for a maximum depth of 0.7 cm  - Cervical margins are negative for carcinoma  - Carcinoma involves anterior and posterior lower uterine segments  - Rest of endometrium shows atrophic and metaplastic changes, negative  for carcinoma  - Bilateral fallopian tubes and ovaries are negative for carcinoma  - No evidence of lymphovascular invasion  - See oncology table  - See comment   B. VAGINAL MARGIN, LEFT, EXCISION:  - Negative for carcinoma  No gross cervical lesion noted on examination of the tissue.  A.   The closest cervical deep margin is 0.3 cm from carcinoma. FINAL MICROSCOPIC DIAGNOSIS:   A. UTERUS, CERIVX, LEFT OVARY, BILATERAL FALLOPIAN TUBES:  - Invasive adenocarcinoma, high nuclear-grade, involving all quadrants  of the cervix  - Carcinoma invades for a maximum depth of 0.7 cm  - Cervical margins are negative for carcinoma  - Carcinoma involves anterior and posterior lower uterine segments  - Rest of endometrium shows atrophic and metaplastic changes, negative  for carcinoma  - Bilateral fallopian tubes and ovaries are negative for  carcinoma  - No evidence of lymphovascular invasion  - See oncology table  - See comment   B. VAGINAL MARGIN, LEFT, EXCISION:  - Negative for carcinoma        COMMENT:   A.  Dr. Luisa Hart reviewed the case and concurs with the diagnosis.  A  gross lesion was not identified in the cervix, but the tumor appears to  be less than 2 cm in greatest linear extent based on microscopic  examination.     ONCOLOGY TABLE:   UTERINE CERVIX, CARCINOMA: Resection   Procedure: Total hysterectomy and bilateral salpingo-oophorectomy  Tumor Size: Cannot be determined  Histologic Type: Adenocarcinoma  Histologic Grade: High grade  Stromal Invasion: Present       Depth of stromal invasion (mm): 7 mm  Other Tissue/ Organ: Not applicable  Margins: All margins negative for invasive carcinoma  Margin Status for HSIL or AIS: All margins negative for HSIL or AIS  Lymphovascular invasion: Not identified       Regional Lymph Nodes: Not applicable (no lymph nodes submitted or  found)  Distant sites involved: Not applicable  Pathologic Stage Classification (pTNM, AJCC 8th Edition): pT1b1, pN not  assigned  Ancillary Studies: Can be performed upon request  Representative Tumor Block: A6    01/14/2022 Imaging   CT A/P: 1. Interval total hysterectomy. In the left hemipelvis, usual location of the ovary but more inferior in position than the ovary on the preoperative scan, there is a 2.8 x 1.2 cm low-density structure which could be the left ovary, postsurgical changes or abnormal lymph node. Consider repeat PET-CT for further study. There is no other suggestion of pelvic adenopathy elsewhere, no abdominal adenopathy. 2. Cystitis versus bladder nondistention. 3. Hepatosplenomegaly with hepatic steatosis. 4. Supraumbilical, umbilical and infraumbilical small anterior wall fat hernias with tiny inguinal fat hernias. 5. Trace ascites in the posterior deep pelvis.     Interval History: Doing well.  Has occasional pain related to GI function, gas.  Otherwise denies any abdominal or pelvic pain.  Reports normal bowel function.  Denies any urinary  symptoms.  Denies any vaginal bleeding.  Occasionally has a sensation of fluid coming out of her vagina but denies any significant discharge or leaking of fluid.  Past Medical/Surgical History: Past Medical History:  Diagnosis Date   Cervical cancer (HCC)    History of radiation therapy    Cervix 07/14/21-08/19/21-Dr. Antony Blackbird   Obesity, Class III, BMI 40-49.9 (morbid obesity) (HCC)    Ovarian tumor (benign) 12/06/1999    Past Surgical History:  Procedure Laterality Date   CYSTOSCOPY N/A 10/04/2021   Procedure: CYSTOSCOPY;  Surgeon: Carver Fila, MD;  Location: [facility];  Service: Gynecology;  Laterality: N/A;   LAPAROSCOPIC UNILATERAL SALPINGO OOPHERECTOMY     right side - lap   ROBOTIC ASSISTED BILATERAL SALPINGO OOPHERECTOMY N/A 10/04/2021   Procedure: XI ROBOTIC ASSISTED LEFT OOPHORECTOMY;  Surgeon: Carver Fila, MD;  Location: [facility];  Service: Gynecology;  Laterality: N/A;   ROBOTIC ASSISTED LAPAROSCOPIC HYSTERECTOMY AND SALPINGECTOMY N/A 10/04/2021   Procedure: XI ROBOTIC ASSISTED LAPAROSCOPIC MODIFIED RADICAL HYSTERECTOMY AND BILATERAL SALPINGECTOMY;  Surgeon: Carver Fila, MD;  Location: [facility];  Service: Gynecology;  Laterality: N/A;    Family History  Problem Relation Age of Onset   Cancer Mother 76       uterine cancer   Colon cancer Neg Hx    Breast cancer Neg Hx    Ovarian cancer Neg Hx    Endometrial  cancer Neg Hx    Pancreatic cancer Neg Hx    Prostate cancer Neg Hx     Social History   Socioeconomic History   Marital status: Divorced    Spouse name: Not on file   Number of children: 2   Years of education: Not on file   Highest education level: Not on file  Occupational History   Occupation: [employer]  Tobacco Use   Smoking status: Never   Smokeless tobacco: Never  Vaping Use   Vaping status: Never Used  Substance and Sexual Activity   Alcohol use: Not Currently   Drug use: No   Sexual activity: Yes    Birth control/protection:  None  Other Topics Concern   Not on file  Social History Narrative   Not on file   Social Determinants of Health   Financial Resource Strain: Not on file  Food Insecurity: Not on file  Transportation Needs: Not on file  Physical Activity: Not on file  Stress: Not on file  Social Connections: Unknown (12/06/2021)   Received from [facility], [facility]   Social Network    Social Network: Not on file    Current Medications: No current outpatient medications on file.  Review of Systems: Denies appetite changes, fevers, chills, fatigue, unexplained weight changes. Denies hearing loss, neck lumps or masses, mouth sores, ringing in ears or voice changes. Denies cough or wheezing.  Denies shortness of breath. Denies chest pain or palpitations. Denies leg swelling. Denies abdominal distention, pain, blood in stools, constipation, diarrhea, nausea, vomiting, or early satiety. Denies pain with intercourse, dysuria, frequency, hematuria or incontinence. Denies hot flashes, pelvic pain, vaginal bleeding or vaginal discharge.   Denies joint pain, back pain or muscle pain/cramps. Denies itching, rash, or wounds. Denies dizziness, headaches, numbness or seizures. Denies swollen lymph nodes or glands, denies easy bruising or bleeding. Denies anxiety, depression, confusion, or decreased concentration.  Physical Exam: BP 136/82   Pulse 82   Temp 97.8 F (36.6 C)   Resp 18   Wt (!) 319 lb 3.2 oz (144.8 kg)   LMP 08/21/2021 Comment: preg.test pending 10/04/21  SpO2 100%   BMI 49.26 kg/m  General: Alert, oriented, no acute distress. HEENT: Normocephalic, atraumatic, sclera anicteric. Chest: Clear to auscultation bilaterally.  No wheezes or rhonchi Cardiovascular: Regular rate and rhythm, no murmurs. Abdomen: Obese, soft, nontender.  Normoactive bowel sounds.  No masses or hepatosplenomegaly appreciated.  Well-healed incisions. Extremities: Grossly normal range of motion.  Warm, well  perfused.  No edema bilaterally. Skin: No rashes or lesions noted. Lymphatics: No cervical, supraclavicular, or inguinal adenopathy. GU: Normal appearing external genitalia without erythema, excoriation, or lesions.  Speculum exam reveals cuff intact, physiologic discharge, no bleeding.  Cuff intact.  Bimanual exam reveals cuff intact, no nodularity or masses.  Rectovaginal exam confirms findings.  Laboratory & Radiologic Studies: None new  Assessment & Plan: Maria Davidson is a 42 y.o. 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 PDL1: CPS 60%   Patient is doing well, is NED on exam today.     The patient will be due for Pap and HPV in January.   Per NCCN surveillance recommendations, we will continue with surveillance visits every 3 months.  We reviewed signs and symptoms again that would be concerning for recurrent cancer, and I stressed the importance of calling if she develops  any of these.  20 minutes of total time was spent for this patient encounter, including preparation, face-to-face counseling with the patient and coordination of care, and documentation of the encounter.  Eugene Garnet, MD  Division of Gynecologic Oncology  Department of Obstetrics and Gynecology  [facility]

## 2023-05-31 ENCOUNTER — Telehealth: Payer: Self-pay | Admitting: *Deleted

## 2023-05-31 NOTE — Telephone Encounter (Signed)
Maria Davidson called to see if there were any lab results from her last visit with Dr. Pricilla Holm. After reviewing her chart, no samples or labs were ordered from that visit. Per Dr. Winferd Humphrey note she is due for a pap in January. Pt is aware and plans to f/u appropriately.

## 2023-08-24 ENCOUNTER — Ambulatory Visit: Payer: Commercial Managed Care - PPO | Admitting: Gynecologic Oncology

## 2023-08-24 DIAGNOSIS — C539 Malignant neoplasm of cervix uteri, unspecified: Secondary | ICD-10-CM

## 2023-08-30 ENCOUNTER — Other Ambulatory Visit: Payer: Self-pay

## 2023-08-30 ENCOUNTER — Encounter: Payer: Self-pay | Admitting: Gynecologic Oncology

## 2023-08-30 ENCOUNTER — Other Ambulatory Visit (HOSPITAL_COMMUNITY)
Admission: RE | Admit: 2023-08-30 | Discharge: 2023-08-30 | Disposition: A | Discharge status: Home / Self Care | Payer: Commercial Managed Care - PPO | Source: Ambulatory Visit | Attending: Gynecologic Oncology | Admitting: Gynecologic Oncology

## 2023-08-30 ENCOUNTER — Inpatient Hospital Stay: Payer: Commercial Managed Care - PPO | Attending: Gynecologic Oncology | Admitting: Gynecologic Oncology

## 2023-08-30 VITALS — BP 140/91 | HR 79 | Temp 98.3°F | Resp 19 | Wt 319.4 lb

## 2023-08-30 DIAGNOSIS — C539 Malignant neoplasm of cervix uteri, unspecified: Secondary | ICD-10-CM

## 2023-08-30 DIAGNOSIS — Z8541 Personal history of malignant neoplasm of cervix uteri: Secondary | ICD-10-CM | POA: Diagnosis not present

## 2023-08-30 DIAGNOSIS — Z90710 Acquired absence of both cervix and uterus: Secondary | ICD-10-CM | POA: Diagnosis not present

## 2023-08-30 DIAGNOSIS — Z90722 Acquired absence of ovaries, bilateral: Secondary | ICD-10-CM | POA: Diagnosis not present

## 2023-08-30 DIAGNOSIS — Z923 Personal history of irradiation: Secondary | ICD-10-CM | POA: Insufficient documentation

## 2023-08-30 NOTE — Patient Instructions (Signed)
It was good to see you today.  I do not see or feel any evidence of cancer recurrence on your exam.  I will see you for follow-up in 6 months.  As always, if you develop any new and concerning symptoms before your next visit, please call to see me sooner.   

## 2023-08-30 NOTE — Progress Notes (Signed)
Gynecologic Oncology Return Clinic Visit  08/30/23  Reason for Visit: Surveillance   Treatment History: Oncology History Overview Note  PD-L1 CPS 60%   Cervical cancer (HCC)  01/05/2021 Initial Diagnosis   In June, she had a period for 3-6 days with significant back pain and pelvic cramping.  This was more bleeding than she had normally had    06/07/2021 Pathology Results   [id number] Cervical biopsy showed adenocarcinoma    06/09/2021 Procedure   Patient underwent colposcopy on 11/3.  Findings at that time was a fleshy, friable mass occupying most of the cervix measuring 3 x 3 cm with polypoid protrusions.  Cervical os unable to be identified.  Biopsies were taken that revealed at least adenocarcinoma in situ.   06/15/2021 Initial Diagnosis   Cervical cancer (HCC)   06/24/2021 PET scan   1. Ill-defined soft tissue and hypermetabolic activity at the expected area of the cervix, compatible with primary malignancy. 2. No evidence of FDG avid metastatic disease in the chest, abdomen or pelvis. 3. Hypermetabolic activity of the left ovary, likely physiologic.   07/01/2021 Cancer Staging   Staging form: Cervix Uteri, AJCC Version 9 - Clinical stage from 07/01/2021: FIGO Stage IB3 (cT1b3, cN0, cM0) - Signed by Artis Delay, MD on 07/01/2021 Stage prefix: Initial diagnosis   07/14/2021 - 08/19/2021 Radiation Therapy   IMRT Declined HDR   10/04/2021 Surgery   Modified radical robotic-assisted laparoscopic total hysterectomy with bilateral salpingectomy, left oophorectomy  Findings: On EUA, cervix 3-4cm, somewhat flush with the left vaginal sidewall, not firm or nodular, no parametrial involvement. On speculum exam, small, <1 cm discoloration around the cervical os that appears c/w tumor vs treated tumor. On intra-abdominal entry, normal upper abdominal survey. Normal omentum, small and large bowel. Somewhat bulbous 8 cm uterus. Right ovary surgically absent. Bilateral tubes normal appearing  with some evidence of possibly endosalpingiosis on the left tubes. Normal left ovary. Some mild retroperitoneal fibrosis and edema consistent recent radiation. Some adhesions between the bladder and LUS/Cervix. No obvious extra-cervical evidence of disease.  Given concern for close margin to the cervix on the left and posterior aspect, additional left and posterior vaginal margin taken. On cystoscopy, bladder including dome intact, good efflux noted from bilateral ureteral orifices.    10/04/2021 Pathology Results   A. UTERUS, CERIVX, LEFT OVARY, BILATERAL FALLOPIAN TUBES:  - Invasive adenocarcinoma, high nuclear-grade, involving all quadrants  of the cervix  - Carcinoma invades for a maximum depth of 0.7 cm  - Cervical margins are negative for carcinoma  - Carcinoma involves anterior and posterior lower uterine segments  - Rest of endometrium shows atrophic and metaplastic changes, negative  for carcinoma  - Bilateral fallopian tubes and ovaries are negative for carcinoma  - No evidence of lymphovascular invasion  - See oncology table  - See comment   B. VAGINAL MARGIN, LEFT, EXCISION:  - Negative for carcinoma  No gross cervical lesion noted on examination of the tissue.  A.   The closest cervical deep margin is 0.3 cm from carcinoma. FINAL MICROSCOPIC DIAGNOSIS:   A. UTERUS, CERIVX, LEFT OVARY, BILATERAL FALLOPIAN TUBES:  - Invasive adenocarcinoma, high nuclear-grade, involving all quadrants  of the cervix  - Carcinoma invades for a maximum depth of 0.7 cm  - Cervical margins are negative for carcinoma  - Carcinoma involves anterior and posterior lower uterine segments  - Rest of endometrium shows atrophic and metaplastic changes, negative  for carcinoma  - Bilateral fallopian tubes and ovaries are negative  for carcinoma  - No evidence of lymphovascular invasion  - See oncology table  - See comment   B. VAGINAL MARGIN, LEFT, EXCISION:  - Negative for carcinoma        COMMENT:   A.  Dr. Luisa Hart reviewed the case and concurs with the diagnosis.  A  gross lesion was not identified in the cervix, but the tumor appears to  be less than 2 cm in greatest linear extent based on microscopic  examination.     ONCOLOGY TABLE:   UTERINE CERVIX, CARCINOMA: Resection   Procedure: Total hysterectomy and bilateral salpingo-oophorectomy  Tumor Size: Cannot be determined  Histologic Type: Adenocarcinoma  Histologic Grade: High grade  Stromal Invasion: Present       Depth of stromal invasion (mm): 7 mm  Other Tissue/ Organ: Not applicable  Margins: All margins negative for invasive carcinoma  Margin Status for HSIL or AIS: All margins negative for HSIL or AIS  Lymphovascular invasion: Not identified       Regional Lymph Nodes: Not applicable (no lymph nodes submitted or  found)  Distant sites involved: Not applicable  Pathologic Stage Classification (pTNM, AJCC 8th Edition): pT1b1, pN not  assigned  Ancillary Studies: Can be performed upon request  Representative Tumor Block: A6    01/14/2022 Imaging   CT A/P: 1. Interval total hysterectomy. In the left hemipelvis, usual location of the ovary but more inferior in position than the ovary on the preoperative scan, there is a 2.8 x 1.2 cm low-density structure which could be the left ovary, postsurgical changes or abnormal lymph node. Consider repeat PET-CT for further study. There is no other suggestion of pelvic adenopathy elsewhere, no abdominal adenopathy. 2. Cystitis versus bladder nondistention. 3. Hepatosplenomegaly with hepatic steatosis. 4. Supraumbilical, umbilical and infraumbilical small anterior wall fat hernias with tiny inguinal fat hernias. 5. Trace ascites in the posterior deep pelvis.     Interval History: The patient reports overall doing well.  She denies any vaginal bleeding or discharge.  Reports baseline bowel bladder function.  She denies any pelvic pain.  Recently  some ice fell on her chest, has some bruising there.  Thinks that she has felt a lump intermittently.  Past Medical/Surgical History: Past Medical History:  Diagnosis Date   Cervical cancer (HCC)    History of radiation therapy    Cervix 07/14/21-08/19/21-Dr. Antony Blackbird   Obesity, Class III, BMI 40-49.9 (morbid obesity) (HCC)    Ovarian tumor (benign) 12/06/1999    Past Surgical History:  Procedure Laterality Date   CYSTOSCOPY N/A 10/04/2021   Procedure: CYSTOSCOPY;  Surgeon: Carver Fila, MD;  Location: [facility];  Service: Gynecology;  Laterality: N/A;   LAPAROSCOPIC UNILATERAL SALPINGO OOPHERECTOMY     right side - lap   ROBOTIC ASSISTED BILATERAL SALPINGO OOPHERECTOMY N/A 10/04/2021   Procedure: XI ROBOTIC ASSISTED LEFT OOPHORECTOMY;  Surgeon: Carver Fila, MD;  Location: [facility];  Service: Gynecology;  Laterality: N/A;   ROBOTIC ASSISTED LAPAROSCOPIC HYSTERECTOMY AND SALPINGECTOMY N/A 10/04/2021   Procedure: XI ROBOTIC ASSISTED LAPAROSCOPIC MODIFIED RADICAL HYSTERECTOMY AND BILATERAL SALPINGECTOMY;  Surgeon: Carver Fila, MD;  Location: [facility];  Service: Gynecology;  Laterality: N/A;    Family History  Problem Relation Age of Onset   Cancer Mother 29       uterine cancer   Colon cancer Neg Hx    Breast cancer Neg Hx    Ovarian cancer Neg Hx    Endometrial cancer Neg Hx    Pancreatic  cancer Neg Hx    Prostate cancer Neg Hx     Social History   Socioeconomic History   Marital status: Divorced    Spouse name: Not on file   Number of children: 2   Years of education: Not on file   Highest education level: Not on file  Occupational History   Occupation: [employer]  Tobacco Use   Smoking status: Never   Smokeless tobacco: Never  Vaping Use   Vaping status: Never Used  Substance and Sexual Activity   Alcohol use: Not Currently   Drug use: No   Sexual activity: Yes    Birth control/protection: None  Other Topics Concern   Not on file  Social History  Narrative   Not on file   Social Drivers of Health   Financial Resource Strain: Not on file  Food Insecurity: Not on file  Transportation Needs: Not on file  Physical Activity: Not on file  Stress: Not on file  Social Connections: Unknown (12/06/2021)   Received from [facility], [facility]   Social Network    Social Network: Not on file    Current Medications: No current outpatient medications on file.  Review of Systems: Denies appetite changes, fevers, chills, fatigue, unexplained weight changes. Denies hearing loss, neck lumps or masses, mouth sores, ringing in ears or voice changes. Denies cough or wheezing.  Denies shortness of breath. Denies chest pain or palpitations. Denies leg swelling. Denies abdominal distention, pain, blood in stools, constipation, diarrhea, nausea, vomiting, or early satiety. Denies pain with intercourse, dysuria, frequency, hematuria or incontinence. Denies hot flashes, pelvic pain, vaginal bleeding or vaginal discharge.   Denies joint pain, back pain or muscle pain/cramps. Denies itching, rash, or wounds. Denies dizziness, headaches, numbness or seizures. Denies swollen lymph nodes or glands, denies easy bruising or bleeding. Denies anxiety, depression, confusion, or decreased concentration.  Physical Exam: BP (!) 140/91 (BP Location: Left Arm, Patient Position: Sitting) Comment: Notified RN  Pulse 79   Temp 98.3 F (36.8 C) (Oral)   Resp 19   Wt (!) 319 lb 6.4 oz (144.9 kg)   LMP 08/21/2021 Comment: preg.test pending 10/04/21  SpO2 100%   BMI 49.29 kg/m  General: Alert, oriented, no acute distress. HEENT: Normocephalic, atraumatic, sclera anicteric. Chest: Clear to auscultation bilaterally.  No wheezes or rhonchi.  Some bruising noted on the left breast.  No palpable masses although patient has dense breast tissue. Cardiovascular: Regular rate and rhythm, no murmurs. Abdomen: Obese, soft, nontender.  Normoactive bowel sounds.  No  masses or hepatosplenomegaly appreciated.  Well-healed incisions. Extremities: Grossly normal range of motion.  Warm, well perfused.  No edema bilaterally. Skin: No rashes or lesions noted. Lymphatics: No cervical, supraclavicular, or inguinal adenopathy. GU: Normal appearing external genitalia without erythema, excoriation, or lesions.  Speculum exam reveals cuff intact, physiologic discharge, no bleeding.  Cuff intact.  Pap and HPV collected.  Bimanual exam reveals cuff intact, no nodularity or masses.  Rectovaginal exam confirms findings.  Laboratory & Radiologic Studies: None new  Assessment & Plan: Maria Davidson is a 43 y.o. 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 PDL1: CPS 60%   Patient is doing well, is NED on exam today.     Pap and HPV performed today.  Recommended trying warm compresses on her breast for the next 1-2 weeks.  Once  the bruising has resolved, if she still feels a lump, I recommended additional work-up with ultrasound and/or mammography.  She has not had a mammogram in quite some time.   Per NCCN surveillance recommendations, we will transition to surveillance visits every 6 months.  We reviewed signs and symptoms again that would be concerning for recurrent cancer, and I stressed the importance of calling if she develops any of these.  22 minutes of total time was spent for this patient encounter, including preparation, face-to-face counseling with the patient and coordination of care, and documentation of the encounter.  Eugene Garnet, MD  Division of Gynecologic Oncology  Department of Obstetrics and Gynecology  [facility]

## 2023-09-04 ENCOUNTER — Encounter: Payer: Self-pay | Admitting: Gynecologic Oncology

## 2023-09-04 LAB — CYTOLOGY - PAP
Comment: NEGATIVE
Diagnosis: NEGATIVE
High risk HPV: NEGATIVE

## 2023-10-05 ENCOUNTER — Encounter: Payer: Self-pay | Admitting: Obstetrics and Gynecology

## 2024-02-26 ENCOUNTER — Telehealth: Payer: Self-pay | Admitting: *Deleted

## 2024-02-26 NOTE — Telephone Encounter (Signed)
 Spoke with patient who requested to reschedule her appt. With Dr. Viktoria from July 25 th., to Sept. 11 th at 2:45.

## 2024-02-29 ENCOUNTER — Ambulatory Visit: Payer: Commercial Managed Care - PPO | Admitting: Gynecologic Oncology

## 2024-04-17 ENCOUNTER — Ambulatory Visit: Admitting: Gynecologic Oncology

## 2024-04-17 ENCOUNTER — Encounter: Payer: Self-pay | Admitting: Gynecologic Oncology

## 2024-04-17 DIAGNOSIS — C539 Malignant neoplasm of cervix uteri, unspecified: Secondary | ICD-10-CM

## 2024-04-17 NOTE — Progress Notes (Unsigned)
 Patient canceled day of appt

## 2024-05-08 ENCOUNTER — Ambulatory Visit: Admitting: Gynecologic Oncology

## 2024-05-08 DIAGNOSIS — C539 Malignant neoplasm of cervix uteri, unspecified: Secondary | ICD-10-CM

## 2024-05-08 NOTE — Progress Notes (Unsigned)
 The patient messaged to cancel/reschedule her appt same day

## 2024-06-10 ENCOUNTER — Inpatient Hospital Stay: Attending: Gynecologic Oncology | Admitting: Gynecologic Oncology

## 2024-06-10 VITALS — BP 144/70 | HR 77 | Temp 98.6°F | Resp 16 | Wt 313.0 lb

## 2024-06-10 DIAGNOSIS — Z9079 Acquired absence of other genital organ(s): Secondary | ICD-10-CM | POA: Diagnosis not present

## 2024-06-10 DIAGNOSIS — R6882 Decreased libido: Secondary | ICD-10-CM | POA: Diagnosis not present

## 2024-06-10 DIAGNOSIS — Z923 Personal history of irradiation: Secondary | ICD-10-CM | POA: Insufficient documentation

## 2024-06-10 DIAGNOSIS — Z90710 Acquired absence of both cervix and uterus: Secondary | ICD-10-CM | POA: Diagnosis not present

## 2024-06-10 DIAGNOSIS — Z8541 Personal history of malignant neoplasm of cervix uteri: Secondary | ICD-10-CM | POA: Insufficient documentation

## 2024-06-10 DIAGNOSIS — Z90722 Acquired absence of ovaries, bilateral: Secondary | ICD-10-CM | POA: Insufficient documentation

## 2024-06-10 DIAGNOSIS — C539 Malignant neoplasm of cervix uteri, unspecified: Secondary | ICD-10-CM

## 2024-06-10 DIAGNOSIS — Z08 Encounter for follow-up examination after completed treatment for malignant neoplasm: Secondary | ICD-10-CM | POA: Insufficient documentation

## 2024-06-10 DIAGNOSIS — B3731 Acute candidiasis of vulva and vagina: Secondary | ICD-10-CM | POA: Insufficient documentation

## 2024-06-10 DIAGNOSIS — Z1231 Encounter for screening mammogram for malignant neoplasm of breast: Secondary | ICD-10-CM

## 2024-06-10 MED ORDER — FLUCONAZOLE 100 MG PO TABS: 100.0000 mg | ORAL_TABLET | Freq: Once | ORAL | 0 refills | Status: AC

## 2024-06-10 MED ORDER — MICONAZOLE NITRATE 2 % EX CREA: 1.0000 | TOPICAL_CREAM | Freq: Two times a day (BID) | CUTANEOUS | 1 refills | Status: DC

## 2024-06-10 NOTE — Progress Notes (Signed)
 Gynecologic Oncology Return Clinic Visit  06/10/24  Reason for Visit: Surveillance   Treatment History: Oncology History Overview Note  PD-L1 CPS 60%   Cervical cancer (HCC)  01/05/2021 Initial Diagnosis   In June, she had a period for 3-6 days with significant back pain and pelvic cramping.  This was more bleeding than she had normally had    06/07/2021 Pathology Results   [id number] Cervical biopsy showed adenocarcinoma    06/09/2021 Procedure   Patient underwent colposcopy on 11/3.  Findings at that time was a fleshy, friable mass occupying most of the cervix measuring 3 x 3 cm with polypoid protrusions.  Cervical os unable to be identified.  Biopsies were taken that revealed at least adenocarcinoma in situ.   06/15/2021 Initial Diagnosis   Cervical cancer (HCC)   06/24/2021 PET scan   1. Ill-defined soft tissue and hypermetabolic activity at the expected area of the cervix, compatible with primary malignancy. 2. No evidence of FDG avid metastatic disease in the chest, abdomen or pelvis. 3. Hypermetabolic activity of the left ovary, likely physiologic.   07/01/2021 Cancer Staging   Staging form: Cervix Uteri, AJCC Version 9 - Clinical stage from 07/01/2021: FIGO Stage IB3 (cT1b3, cN0, cM0) - Signed by Lonn Hicks, MD on 07/01/2021 Stage prefix: Initial diagnosis   07/14/2021 - 08/19/2021 Radiation Therapy   IMRT Declined HDR   10/04/2021 Surgery   Modified radical robotic-assisted laparoscopic total hysterectomy with bilateral salpingectomy, left oophorectomy  Findings: On EUA, cervix 3-4cm, somewhat flush with the left vaginal sidewall, not firm or nodular, no parametrial involvement. On speculum exam, small, <1 cm discoloration around the cervical os that appears c/w tumor vs treated tumor. On intra-abdominal entry, normal upper abdominal survey. Normal omentum, small and large bowel. Somewhat bulbous 8 cm uterus. Right ovary surgically absent. Bilateral tubes normal appearing  with some evidence of possibly endosalpingiosis on the left tubes. Normal left ovary. Some mild retroperitoneal fibrosis and edema consistent recent radiation. Some adhesions between the bladder and LUS/Cervix. No obvious extra-cervical evidence of disease.  Given concern for close margin to the cervix on the left and posterior aspect, additional left and posterior vaginal margin taken. On cystoscopy, bladder including dome intact, good efflux noted from bilateral ureteral orifices.    10/04/2021 Pathology Results   A. UTERUS, CERIVX, LEFT OVARY, BILATERAL FALLOPIAN TUBES:  - Invasive adenocarcinoma, high nuclear-grade, involving all quadrants  of the cervix  - Carcinoma invades for a maximum depth of 0.7 cm  - Cervical margins are negative for carcinoma  - Carcinoma involves anterior and posterior lower uterine segments  - Rest of endometrium shows atrophic and metaplastic changes, negative  for carcinoma  - Bilateral fallopian tubes and ovaries are negative for carcinoma  - No evidence of lymphovascular invasion  - See oncology table  - See comment   B. VAGINAL MARGIN, LEFT, EXCISION:  - Negative for carcinoma  No gross cervical lesion noted on examination of the tissue.  A.   The closest cervical deep margin is 0.3 cm from carcinoma. FINAL MICROSCOPIC DIAGNOSIS:   A. UTERUS, CERIVX, LEFT OVARY, BILATERAL FALLOPIAN TUBES:  - Invasive adenocarcinoma, high nuclear-grade, involving all quadrants  of the cervix  - Carcinoma invades for a maximum depth of 0.7 cm  - Cervical margins are negative for carcinoma  - Carcinoma involves anterior and posterior lower uterine segments  - Rest of endometrium shows atrophic and metaplastic changes, negative  for carcinoma  - Bilateral fallopian tubes and ovaries are negative  for carcinoma  - No evidence of lymphovascular invasion  - See oncology table  - See comment   B. VAGINAL MARGIN, LEFT, EXCISION:  - Negative for carcinoma        COMMENT:   A.  Dr. Belvie reviewed the case and concurs with the diagnosis.  A  gross lesion was not identified in the cervix, but the tumor appears to  be less than 2 cm in greatest linear extent based on microscopic  examination.     ONCOLOGY TABLE:   UTERINE CERVIX, CARCINOMA: Resection   Procedure: Total hysterectomy and bilateral salpingo-oophorectomy  Tumor Size: Cannot be determined  Histologic Type: Adenocarcinoma  Histologic Grade: High grade  Stromal Invasion: Present       Depth of stromal invasion (mm): 7 mm  Other Tissue/ Organ: Not applicable  Margins: All margins negative for invasive carcinoma  Margin Status for HSIL or AIS: All margins negative for HSIL or AIS  Lymphovascular invasion: Not identified       Regional Lymph Nodes: Not applicable (no lymph nodes submitted or  found)  Distant sites involved: Not applicable  Pathologic Stage Classification (pTNM, AJCC 8th Edition): pT1b1, pN not  assigned  Ancillary Studies: Can be performed upon request  Representative Tumor Block: A6    01/14/2022 Imaging   CT A/P: 1. Interval total hysterectomy. In the left hemipelvis, usual location of the ovary but more inferior in position than the ovary on the preoperative scan, there is a 2.8 x 1.2 cm low-density structure which could be the left ovary, postsurgical changes or abnormal lymph node. Consider repeat PET-CT for further study. There is no other suggestion of pelvic adenopathy elsewhere, no abdominal adenopathy. 2. Cystitis versus bladder nondistention. 3. Hepatosplenomegaly with hepatic steatosis. 4. Supraumbilical, umbilical and infraumbilical small anterior wall fat hernias with tiny inguinal fat hernias. 5. Trace ascites in the posterior deep pelvis.    Interval History: The patient presents today to the office for continued surveillance.  She reports overall doing well since her last visit.  She is tolerating her diet with no nausea,  emesis or appetite changes.  No abdominal or pelvic pain reported.  Denies abdominal bloating or early satiety.  Bladder is functioning at baseline without hematuria or dysuria.  No vaginal bleeding or discharge reported.  Bowels are functioning without difficulty.  She does report having vulvar irritation after switching her laundry detergent.  She has had intermittent itching with this.  No lower extremity edema or pain reported.  She does report having a decreased libido after having surgery.  She has not had a mammogram and would be interested in undergoing this at the beginning of the year. No symptoms concerning for recurrence voiced.  Past Medical/Surgical History: Past Medical History:  Diagnosis Date   Cervical cancer (HCC)    History of radiation therapy    Cervix 07/14/21-08/19/21-Dr. Lynwood Nasuti   Obesity, Class III, BMI 40-49.9 (morbid obesity) (HCC)    Ovarian tumor (benign) 12/06/1999    Past Surgical History:  Procedure Laterality Date   CYSTOSCOPY N/A 10/04/2021   Procedure: CYSTOSCOPY;  Surgeon: Viktoria Comer SAUNDERS, MD;  Location: [facility];  Service: Gynecology;  Laterality: N/A;   LAPAROSCOPIC UNILATERAL SALPINGO OOPHERECTOMY     right side - lap   ROBOTIC ASSISTED BILATERAL SALPINGO OOPHERECTOMY N/A 10/04/2021   Procedure: XI ROBOTIC ASSISTED LEFT OOPHORECTOMY;  Surgeon: Viktoria Comer SAUNDERS, MD;  Location: [facility];  Service: Gynecology;  Laterality: N/A;   ROBOTIC ASSISTED LAPAROSCOPIC HYSTERECTOMY AND  SALPINGECTOMY N/A 10/04/2021   Procedure: XI ROBOTIC ASSISTED LAPAROSCOPIC MODIFIED RADICAL HYSTERECTOMY AND BILATERAL SALPINGECTOMY;  Surgeon: Viktoria Comer SAUNDERS, MD;  Location: [facility];  Service: Gynecology;  Laterality: N/A;    Family History  Problem Relation Age of Onset   Cancer Mother 65       uterine cancer   Colon cancer Neg Hx    Breast cancer Neg Hx    Ovarian cancer Neg Hx    Endometrial cancer Neg Hx    Pancreatic cancer Neg Hx    Prostate cancer Neg Hx      Social History   Socioeconomic History   Marital status: Divorced    Spouse name: Not on file   Number of children: 2   Years of education: Not on file   Highest education level: Not on file  Occupational History   Occupation: [employer]  Tobacco Use   Smoking status: Never   Smokeless tobacco: Never  Vaping Use   Vaping status: Never Used  Substance and Sexual Activity   Alcohol use: Not Currently   Drug use: No   Sexual activity: Yes    Birth control/protection: None  Other Topics Concern   Not on file  Social History Narrative   Not on file   Social Drivers of Health   Financial Resource Strain: Not on file  Food Insecurity: Not on file  Transportation Needs: Not on file  Physical Activity: Not on file  Stress: Not on file  Social Connections: Unknown (12/06/2021)   Received from [facility]   Social Network    Social Network: Not on file    Current Medications:  Current Outpatient Medications:    fluconazole  (DIFLUCAN ) 100 MG tablet, Take 1 tablet (100 mg total) by mouth once for 1 dose., Disp: 1 tablet, Rfl: 0   miconazole (MICATIN) 2 % cream, Apply 1 Application topically 2 (two) times daily. Apply to the redness/itching areas on the vulva, Disp: 28.35 g, Rfl: 1  Review of Systems: + for decreased libido, vision changes with evaluation today/recommendation for glasses for long distances.  Denies appetite changes, fevers, chills, fatigue, unexplained weight changes. Denies hearing loss, neck lumps or masses, mouth sores, ringing in ears or voice changes. Denies cough or wheezing.  Denies shortness of breath. Denies chest pain or palpitations. Denies leg swelling. Denies abdominal distention, pain, blood in stools, constipation, diarrhea, nausea, vomiting, or early satiety. Denies pain with intercourse, dysuria, frequency, hematuria or incontinence. Denies hot flashes, pelvic pain, vaginal bleeding or vaginal discharge.   Denies joint pain, back pain or  muscle pain/cramps. Denies itching, rash, or wounds. Denies dizziness, headaches, numbness or seizures. Denies swollen lymph nodes or glands, denies easy bruising or bleeding. Denies anxiety, depression, confusion, or decreased concentration.  Physical Exam: BP (!) 144/70 (BP Location: Left Arm, Patient Position: Sitting)   Pulse 77   Temp 98.6 F (37 C) (Oral)   Resp 16   Wt (!) 313 lb (142 kg)   LMP 08/21/2021 Comment: preg.test pending 10/04/21  SpO2 100%   BMI 48.30 kg/m  General: Alert, oriented, no acute distress. HEENT: Normocephalic, atraumatic, sclera anicteric. Chest: Clear to auscultation bilaterally.  No wheezes or rhonchi.   Cardiovascular: Regular rate and rhythm, no murmurs. Abdomen: Obese, soft, nontender.  Normoactive bowel sounds.  No masses or hepatosplenomegaly appreciated.  Well-healed incisions. Extremities: Grossly normal range of motion.  Warm, well perfused.  No edema bilaterally. Skin: No rashes or lesions noted. Lymphatics: No cervical, supraclavicular, or inguinal adenopathy.  GU: Normal appearing external genitalia without excoriation or lesions. Mild erythema noted bilaterally on the lateral aspects of the vulva.  Speculum exam reveals cuff intact, mild white discharge, no bleeding.  Bimanual exam reveals cuff intact and smooth, no nodularity or masses.  Rectovaginal exam confirms findings.  Laboratory & Radiologic Studies: Last pap smear on Jan. 23, 2025 negative with HPV high risk negative.  Assessment & Plan: Maria Davidson is a 43 y.o. 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 PDL1: CPS 60%   Patient is doing well, is NED on exam today. One time dose of diflucan  and topical miconazole cream prescribed for vulvo-vaginal candidiasis. We discussed mammography for breast cancer  screening. She is agreeable with having this at the beginning of the year. Order has been placed and patient advised she should be receiving a phone call to schedule an appointment. Contact and location information given for the [facility] for [facility]. Booklet from the [employer] on sexuality for women with cancer (hx) given to patient.   Per NCCN surveillance recommendations, we will continue surveillance visits every 6 months.  We reviewed signs and symptoms again that would be concerning for recurrent cancer, and I stressed the importance of calling if she develops any of these.  20 minutes of total time was spent for this patient encounter, including preparation, face-to-face counseling with the patient and coordination of care, and documentation of the encounter.  Eleanor Epps NP [facility]

## 2024-06-10 NOTE — Patient Instructions (Addendum)
 It was good to see you today.  I do not see or feel any evidence of cancer recurrence on your exam.   We will see you for follow-up in 6 months.   As always, if you develop any new and concerning symptoms before your next visit, please call to see me sooner.  Symptoms to report to your health care team include vaginal bleeding, rectal bleeding, bloating, weight loss without effort, new and persistent pain, new and  persistent fatigue, new leg swelling, new masses (i.e., bumps in your neck or groin), new and persistent cough, new and persistent nausea and vomiting, change in bowel or bladder habits, and any other concerns.   We have placed a referral for a mammogram at the [facility] near [facility]. You should receive a phone call to schedule this. If you do not hear from them, you can call their office or let us  know.

## 2024-09-04 ENCOUNTER — Telehealth: Payer: Self-pay | Admitting: *Deleted

## 2024-09-04 ENCOUNTER — Other Ambulatory Visit: Payer: Self-pay | Admitting: Gynecologic Oncology

## 2024-09-04 DIAGNOSIS — B3731 Acute candidiasis of vulva and vagina: Secondary | ICD-10-CM

## 2024-09-04 MED ORDER — MICONAZOLE NITRATE 2 % EX CREA: 1.0000 | TOPICAL_CREAM | Freq: Two times a day (BID) | CUTANEOUS | 1 refills | Status: AC

## 2024-09-04 MED ORDER — FLUCONAZOLE 100 MG PO TABS: 100.0000 mg | ORAL_TABLET | Freq: Once | ORAL | 0 refills | Status: AC

## 2024-09-04 NOTE — Telephone Encounter (Signed)
 Spoke with patient and relayed message from provider that Rx has been sent in to her pharmacy. Pt thanked the office for calling.

## 2024-09-04 NOTE — Telephone Encounter (Signed)
 Spoke with patient who called the office stating she is having some vaginal irritation and sometimes burning. Pt denies fever, chills, vaginal bleeding and no vaginal discharge.   Patient reports that the burning is on the tip of the vaginal entrance. Patient states when she last had appointment with Eleanor Epps, NP that she prescribed cream and an oral pill. Pt reports she still has the cream and is asking for a refill on the oral medication?  Advised patient since she no longer has her ovaries she maybe experiencing vaginal dryness which maybe the reason for the irritation and burning. Also advised that her message will be relayed to provider and the office will call back with recommendations.

## 2024-11-07 ENCOUNTER — Ambulatory Visit: Admitting: Gynecologic Oncology
